# Patient Record
Sex: Female | Born: 1975
Health system: Southern US, Community
[De-identification: ages and names within clinical notes are randomized; demographics above are authoritative.]

## PROBLEM LIST (undated history)

## (undated) DIAGNOSIS — M519 Unspecified thoracic, thoracolumbar and lumbosacral intervertebral disc disorder: Secondary | ICD-10-CM

## (undated) HISTORY — PX: WISDOM TOOTH EXTRACTION: SHX21

## (undated) HISTORY — DX: Unspecified thoracic, thoracolumbar and lumbosacral intervertebral disc disorder: M51.9

## (undated) HISTORY — PX: INTRAUTERINE DEVICE (IUD) INSERTION: SHX5877

---

## 1999-12-26 ENCOUNTER — Encounter: Admission: RE | Admit: 1999-12-26 | Discharge: 1999-12-26 | Payer: Self-pay | Admitting: Family Medicine

## 1999-12-26 ENCOUNTER — Encounter: Payer: Self-pay | Admitting: Family Medicine

## 1999-12-30 ENCOUNTER — Other Ambulatory Visit: Admission: RE | Admit: 1999-12-30 | Discharge: 1999-12-30 | Payer: Self-pay | Admitting: Obstetrics and Gynecology

## 2000-11-13 ENCOUNTER — Encounter: Admission: RE | Admit: 2000-11-13 | Discharge: 2000-11-13 | Payer: Self-pay | Admitting: Family Medicine

## 2000-11-13 ENCOUNTER — Encounter: Payer: Self-pay | Admitting: Family Medicine

## 2001-02-24 ENCOUNTER — Other Ambulatory Visit: Admission: RE | Admit: 2001-02-24 | Discharge: 2001-02-24 | Payer: Self-pay | Admitting: Obstetrics and Gynecology

## 2001-12-01 ENCOUNTER — Emergency Department (HOSPITAL_COMMUNITY): Admission: EM | Admit: 2001-12-01 | Discharge: 2001-12-02 | Payer: Self-pay

## 2002-09-26 ENCOUNTER — Encounter: Admission: RE | Admit: 2002-09-26 | Discharge: 2002-09-26 | Payer: Self-pay | Admitting: Family Medicine

## 2002-09-26 ENCOUNTER — Encounter: Payer: Self-pay | Admitting: Family Medicine

## 2004-10-16 ENCOUNTER — Other Ambulatory Visit: Admission: RE | Admit: 2004-10-16 | Discharge: 2004-10-16 | Payer: Self-pay | Admitting: Obstetrics and Gynecology

## 2007-05-07 ENCOUNTER — Other Ambulatory Visit: Admission: RE | Admit: 2007-05-07 | Discharge: 2007-05-07 | Payer: Self-pay | Admitting: Obstetrics and Gynecology

## 2008-05-02 ENCOUNTER — Ambulatory Visit: Payer: Self-pay | Admitting: Obstetrics and Gynecology

## 2008-05-03 ENCOUNTER — Ambulatory Visit: Payer: Self-pay | Admitting: Obstetrics and Gynecology

## 2008-11-21 ENCOUNTER — Inpatient Hospital Stay (HOSPITAL_COMMUNITY): Admission: AD | Admit: 2008-11-21 | Discharge: 2008-11-24 | Payer: Self-pay | Admitting: Obstetrics and Gynecology

## 2010-10-29 LAB — CBC
HCT: 28.5 % — ABNORMAL LOW (ref 36.0–46.0)
HCT: 34.4 % — ABNORMAL LOW (ref 36.0–46.0)
Hemoglobin: 10.1 g/dL — ABNORMAL LOW (ref 12.0–15.0)
Hemoglobin: 12.3 g/dL (ref 12.0–15.0)
MCHC: 35.5 g/dL (ref 30.0–36.0)
MCHC: 35.6 g/dL (ref 30.0–36.0)
MCV: 93.6 fL (ref 78.0–100.0)
MCV: 95.2 fL (ref 78.0–100.0)
Platelets: 161 10*3/uL (ref 150–400)
Platelets: 181 10*3/uL (ref 150–400)
RBC: 2.99 MIL/uL — ABNORMAL LOW (ref 3.87–5.11)
RBC: 3.68 MIL/uL — ABNORMAL LOW (ref 3.87–5.11)
RDW: 14.1 % (ref 11.5–15.5)
RDW: 14.4 % (ref 11.5–15.5)
WBC: 8.6 10*3/uL (ref 4.0–10.5)
WBC: 9.7 10*3/uL (ref 4.0–10.5)

## 2010-10-29 LAB — RPR: RPR Ser Ql: NONREACTIVE

## 2010-12-03 NOTE — Op Note (Signed)
[name], [name]               ACCOUNT NO.:  [id number]   MEDICAL RECORD NO.:  [id number]          PATIENT TYPE:  INP   LOCATION:  9130                          FACILITY:  [facility]   PHYSICIAN:  Malva Limes, M.D.    DATE OF BIRTH:  1976-05-21   DATE OF PROCEDURE:  11/21/2008  DATE OF DISCHARGE:                               OPERATIVE REPORT   PREOPERATIVE DIAGNOSES:  1. Intrauterine pregnancy at term.  2. Persistent breech presentation.  3. The patient declined attempt at version, external version.  4. Bipolar disorder.   POSTOPERATIVE DIAGNOSES:  1. Intrauterine pregnancy at term.  2. Persistent breech presentation.  3. The patient declined attempt at version, external version.  4. Bipolar disorder.   PROCEDURE:  Primary low transverse cesarean section.   SURGEON:  Malva Limes, MD   ASSISTANT:  Luvenia Redden, MD   ANESTHESIA:  Spinal.   ANTIBIOTIC:  Ancef 1 g.   DRAINS:  Foley to bedside drainage.   ESTIMATED BLOOD LOSS:  900 mL.   COMPLICATIONS:  None.   SPECIMENS:  None.   FINDINGS:  The patient had normal fallopian tubes and ovaries  bilaterally.  The uterus appeared to be normal in shape.  There was no  evidence of any uterine septum.   PROCEDURE IN DETAIL:  The patient was taken to the operating room where  spinal anesthetic was administered without difficulty.  She was then  placed in dorsal supine position with left lateral tilt.  The patient  was prepped with Betadine and draped in the usual fashion for this  procedure.  A Foley catheter was placed.  A Pfannenstiel incision was  made approximately 2 cm above the pubic symphysis.  On entering the  abdominal cavity, the bladder flap was taken down with sharp dissection.  The uterine incision was made in the midline with Metzenbaum scissors.  The amniotic sac was entered and fluid noted clear.  The incision was  extended with blunt dissection.  The infant was delivered in a breech  presentation.  On  delivery of the head, the oropharynx and nostrils were  bulb suctioned.  The cord was doubly clamped and cut, and the infant was  handed to the awaiting NICU team.  Placenta was manually removed.  The  uterus was exteriorized.  The uterine cavity was cleaned with a wet lap  and inspected.  The uterine incision was closed in a single layer of 0  Monocryl in a running locking fashion.  The bladder flap was closed  using 2-0 Monocryl in a running fashion.  Uterus was placed back in the  abdominal cavity.  Hemostasis was checked and felt to be adequate.  The  parietal peritoneum and rectus muscles were approximated in the midline  using 2-0 Monocryl in a running fashion.  The fascia was closed using 0  Monocryl suture in a running fashion.  Subcuticular tissue was made  hemostatic with a Bovie.  The subcuticular tissue was closed with  interrupted 2-0 plain gut suture.  The skin was closed with 3-0 Vicryl  in a subcuticular fashion.  Steri-Strips  were then applied.  The patient  was taken to the recovery room in stable condition.  Instrument and lap  counts were correct x2.           ______________________________  Malva Limes, M.D.     MA/MEDQ  D:  11/21/2008  T:  11/21/2008  Job:  [id number]

## 2010-12-06 NOTE — Discharge Summary (Signed)
NAMEAIRIKA, Lindsey [name]               ACCOUNT NO.:  [id number]   MEDICAL RECORD NO.:  [id number]          PATIENT TYPE:  INP   LOCATION:  9130                          FACILITY:  [facility]   PHYSICIAN:  Randye Lobo, M.D.   DATE OF BIRTH:  Nov 20, 1975   DATE OF ADMISSION:  11/21/2008  DATE OF DISCHARGE:  11/24/2008                               DISCHARGE SUMMARY   FINAL DIAGNOSES:  Intrauterine pregnancy at term, persistent breech  presentation, declines attempted external cephalic version, bipolar  disorder.   PROCEDURE:  Primary low transverse cesarean section.   SURGEON:  Malva Limes, MD   ASSISTANT:  Luvenia Redden, MD   COMPLICATIONS:  None.   This 35 year old G2, P 0-0-1-0 presents at 59 weeks' gestation for a  primary cesarean section.  The patient has had a persistent breech  presentation and declines any attempts at external version.  The  patient's antepartum course up to this point have been complicated known  to bipolar disorder.  The patient had been on Lamictal and Klonopin  throughout her pregnancy; otherwise, she has had an uncomplicated  antepartum course.  The patient was taken to the operating room on Nov 21, 2008, by Dr. Malva Limes where a primary low transverse cesarean  section was performed with the delivery of an 8-pound 7-ounce female  infant with Apgars of 9 and 9.  The delivery went without complications.  The patient's postoperative course was benign without any significant  fevers.  The patient was felt ready for discharge on postoperative day  #3.  The patient was sent home on a regular diet, told to decrease  activities, told to continue her prenatal vitamins, given a prescription  for Percocet 1-2 every 4-6 hours as needed for pain, told she could use  over-the-counter ibuprofen up to 600 mg every 6 hours as needed for  pain, and discussion was held with the patient regarding Lamictal and  Klonopin use during breastfeeding, and the patient will be  talking to  her psychiatrist and pediatrician to determine that use.  The patient  was to follow up in our office in 4 weeks.  Instructions and precautions  were reviewed with the patient.   LABORATORY FINDINGS ON DISCHARGE:  The patient had a hemoglobin of 10.1,  white blood cell count of 9.7, and platelets of 161,000.      Leilani Able, P.A.-C.      Randye Lobo, M.D.  Electronically Signed   MB/MEDQ  D:  12/13/2008  T:  12/14/2008  Job:  [id number]

## 2012-04-29 ENCOUNTER — Encounter: Payer: Self-pay | Admitting: Gynecology

## 2012-04-29 ENCOUNTER — Ambulatory Visit (INDEPENDENT_AMBULATORY_CARE_PROVIDER_SITE_OTHER): Payer: BC Managed Care – PPO

## 2012-04-29 ENCOUNTER — Ambulatory Visit (INDEPENDENT_AMBULATORY_CARE_PROVIDER_SITE_OTHER): Payer: BC Managed Care – PPO | Admitting: Gynecology

## 2012-04-29 VITALS — BP 132/88 | Ht 64.0 in | Wt 247.0 lb

## 2012-04-29 DIAGNOSIS — R102 Pelvic and perineal pain: Secondary | ICD-10-CM

## 2012-04-29 DIAGNOSIS — N949 Unspecified condition associated with female genital organs and menstrual cycle: Secondary | ICD-10-CM

## 2012-04-29 DIAGNOSIS — Q51810 Arcuate uterus: Secondary | ICD-10-CM

## 2012-04-29 DIAGNOSIS — M519 Unspecified thoracic, thoracolumbar and lumbosacral intervertebral disc disorder: Secondary | ICD-10-CM | POA: Insufficient documentation

## 2012-04-29 DIAGNOSIS — Q519 Congenital malformation of uterus and cervix, unspecified: Secondary | ICD-10-CM

## 2012-04-29 DIAGNOSIS — E282 Polycystic ovarian syndrome: Secondary | ICD-10-CM | POA: Insufficient documentation

## 2012-04-29 DIAGNOSIS — N8300 Follicular cyst of ovary, unspecified side: Secondary | ICD-10-CM

## 2012-04-29 NOTE — Progress Notes (Addendum)
Patient is a new patient to the practice she is a 36 year old gravida 1 para 1 who had a C-section 3 years ago in another group here in [facility] [place]. The reason for her visit today as for the past 4 months she states that for a few days before her menses she begins having suprapubic discomfort near her incision site. 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 She is overweight with a BMI of 42.40 (247 pounds). She is using condoms for contraception. She also states that time she has noted some hair underneath her chin. She denies any dyspareunia. She states her cycles are regular that last for 5 days. She has been under the care of Dr. Sharolyn Douglas because chronic back pain as a result of a torn L4-L5 disc for which she takes Neurontin. Patient states her last Pap smear was in 2009. Patient denies any abnormal Pap smears in the past.  Exam: Abdomen pendulous Pfannenstiel scar intact I was not able to reproduce any tenderness at the Pfannenstiel incision site. There was no evidence of any incisional hernia on Valsalva maneuver or any inguinal or femoral hernia. She was then examined in the erect position and no hernias in these regions were palpated. After she was laying down we did do a pelvic exam: Bartholin urethra Skene and: Within normal limits Vagina: No lesion discharge Cervix: No lesion discharge uterus/adnexa: Limited due to patient's abdominal girth. Rectal exam: Not done  Ultrasound: Uterus measures 7.2 x 6.1 x 4.0 cm endometrial stripe 5.7 mm uterus appears to be either an arcuate versus septated uterus with Right endometrium 5 mm left endometrium 4 mm. Both right and left ovary were normal. Several follicles are noted on each ovary. No apparent masses seen on either adnexa.  Assessment/plan: We discussed patient's symptomatology that could be attributed to endometriosis although she has no dyspareunia but her discomfort is usually a few  days from the start of her menses. We discussed different treatment options such as oral contraceptive pill which would decrease her circulating testosterone levels and help with her orally skin and hair underneath her chin since she could potentially be PCO S. We also discussed the Depo-Provera injection but she is concerned about weight gain. We did discuss the Lupron but of explained her that would be mostly for 6 months but not for long-term treatment. Finally we discussed the Mirena IUD which would help on her dysmenorrhea menorrhagia pelvic pain and in the event that she does have underlying endometriosis may help as well. Literature information was provided all the subjects and she's going to return back next week so we can do of sonohysterogram to determine if indeed it is a septum and to see how far down into the cavity it does extend because she may not be a candidate for an IUD placement and then she will need to consider oral contraceptive pill (her concern the past was weight gain on the oral contraceptive pill. Patient otherwise states her cycles are regular.

## 2012-04-29 NOTE — Patient Instructions (Addendum)
Endometriosis Endometriosis is a disease that occurs when the endometrium (lining of the uterus) is misplaced outside of its normal location. It may occur in many locations close to the uterus (womb), but commonly on the ovaries, fallopian tubes, vagina (birth canal) and bowel located close to the uterus. Because the uterus sloughs (expels) its lining every month (menses), there is bleeding whereever the endometrial tissue is located. SYMPTOMS  Often there are no symptoms. However, because blood is irritating to tissues not normally exposed to it, when symptoms occur they vary with the location of the misplaced endometrium. Symptoms often include back and abdominal pain. Periods may be heavier and intercourse may be painful. Infertility may be present. You may have all of these symptoms at one time or another or you may have months with no symptoms at all. Although the symptoms occur mainly during menses, they can occur mid-cycle as well, and usually terminate with menopause. DIAGNOSIS  Your caregiver may recommend a blood test and urine test (urinalysis) to help rule out other conditions. Another common test is ultrasound, a painless procedure that uses sound waves to make a sonogram "picture" of abnormal tissue that could be endometriosis. If your bowel movements are painful around your periods, your caregiver may advise a barium enema (an X-ray of the lower bowel), to try to find the source of your pain. This is sometimes confirmed by laparoscopy. Laparoscopy is a procedure where your caregiver looks into your abdomen with a laparoscope (a small pencil sized telescope). Your caregiver may take a tiny piece of tissue (biopsy) from any abnormal tissue to confirm or document your problem. These tissues are sent to the lab and a pathologist looks at them under the microscope to give a microscopic diagnosis. TREATMENT  Once the diagnosis is made, it can be treated by destruction of the misplaced endometrial  tissue using heat (diathermy), laser, cutting (excision), or chemical means. It may also be treated with hormonal therapy. When using hormonal therapy menses are eliminated, therefore eliminating the monthly exposure to blood by the misplaced endometrial tissue. Only in severe cases is it necessary to perform a hysterectomy with removal of the tubes, uterus and ovaries. HOME CARE INSTRUCTIONS   Only take over-the-counter or prescription medicines for pain, discomfort, or fever as directed by your caregiver.  Avoid activities that produce pain, including a physical sexual relationship.  Do not take aspirin as this may increase bleeding when not on hormonal therapy.  See your caregiver for pain or problems not controlled with treatment. SEEK IMMEDIATE MEDICAL CARE IF:   Your pain is severe and is not responding to pain medicine.  You develop severe nausea and vomiting, or you cannot keep foods down.  Your pain localizes to the right lower part of your abdomen (possible appendicitis).  You have swelling or increasing pain in the abdomen.  You have a fever.  You see blood in your stool. MAKE SURE YOU:   Understand these instructions.  Will watch your condition.  Will get help right away if you are not doing well or get worse. Document Released: 07/04/2000 Document Revised: 09/29/2011 Document Reviewed: 02/23/2008 [facility] Patient Information 2013 [place], [place].  Intrauterine Device Information An intrauterine device (IUD) is inserted into your uterus and prevents pregnancy. There are 2 types of IUDs available:  Copper IUD. This type of IUD is wrapped in copper wire and is placed inside the uterus. Copper makes the uterus and fallopian tubes produce a fluid that kills sperm. The copper IUD can  stay in place for 10 years.  Hormone IUD. This type of IUD contains the hormone progestin (synthetic progesterone). The hormone thickens the cervical mucus and prevents sperm from entering  the uterus, and it also thins the uterine lining to prevent implantation of a fertilized egg. The hormone can weaken or kill the sperm that get into the uterus. The hormone IUD can stay in place for 5 years. Your caregiver will make sure you are a good candidate for a contraceptive IUD. Discuss with your caregiver the possible side effects. ADVANTAGES  It is highly effective, reversible, long-acting, and low maintenance.  There are no estrogen-related side effects.  An IUD can be used when breastfeeding.  It is not associated with weight gain.  It works immediately after insertion.  The copper IUD does not interfere with your female hormones.  The progesterone IUD can make heavy menstrual periods lighter.  The progesterone IUD can be used for 5 years.  The copper IUD can be used for 10 years. DISADVANTAGES  The progesterone IUD can be associated with irregular bleeding patterns.  The copper IUD can make your menstrual flow heavier and more painful.  You may experience cramping and vaginal bleeding after insertion. Document Released: 06/10/2004 Document Revised: 09/29/2011 Document Reviewed: 11/09/2010 [facility] Patient Information 2013 [place], [place].  Oral Contraception Use Oral contraceptives (OCs) are medicines taken to prevent pregnancy. OCs work by preventing the ovaries from releasing eggs. The hormones in OCs also cause the cervical mucus to thicken, preventing the sperm from entering the uterus. The hormones also cause the uterine lining to become thin, not allowing a fertilized egg to attach to the inside of the uterus. OCs are highly effective when taken exactly as prescribed. However, OCs do not prevent sexually transmitted diseases (STDs). Safe sex practices, such as using condoms along with an OC, can help prevent STDs.  Before taking OCs, you may have a physical exam and Pap test. Your caregiver may also order blood tests if necessary. Your caregiver will make sure you  are a good candidate for oral contraception. Discuss with your caregiver the possible side effects of the OC you may be prescribed. When starting an OC, it can take 2 to 3 months for the body to adjust to the changes in hormone levels in your body.  HOW TO TAKE ORAL CONTRACEPTIVES Your caregiver may advise you on how to start taking the first cycle of OCs. Otherwise, you can:  Start on day 1 of your menstrual period. You will not need any backup contraceptive protection with this start time.  Start on the first Sunday after your menstrual period or the day you get your prescription. In these cases, you will need to use backup contraceptive protection for the first 7-day cycle. After you have started taking OCs:  If you forget to take 1 pill, take it as soon as you remember. Take the next pill at the regular time.  If you miss 2 or more pills, use backup birth control until your next menstrual period starts.  If you use a 28-day pack that contains inactive pills and you miss 1 of the last 7 pills (pills with no hormones), it will not matter. Throw away the rest of the non-hormone pills and start a new pill pack. No matter which day you start the OC, you will always start a new pack on that same day of the week. Have an extra pack of OCs and a backup contraceptive method available in case you miss  some pills or lose your OC pack. HOME CARE INSTRUCTIONS   Do not smoke.  Always use a condom to protect against STDs. OCs do not protect against STDs.  Use a calendar to mark your menstrual period days.  Read the information and directions that come with your OC. Talk to your caregiver if you have questions. SEEK MEDICAL CARE IF:   You develop nausea and vomiting.  You have abnormal vaginal discharge or bleeding.  You develop a rash.  You miss your menstrual period.  You are losing your hair.  You need treatment for mood swings or depression.  You get dizzy when taking the OC.  You  develop acne from taking the OC.  You become pregnant. SEEK IMMEDIATE MEDICAL CARE IF:   You develop chest pain.  You develop shortness of breath.  You have an uncontrolled or severe headache.  You develop numbness or slurred speech.  You develop visual problems.  You develop pain, redness, and swelling in the legs. Document Released: 06/26/2011 Document Revised: 09/29/2011 Document Reviewed: 06/26/2011 [facility] Patient Information 2013 [place], [place].

## 2012-05-07 ENCOUNTER — Ambulatory Visit (INDEPENDENT_AMBULATORY_CARE_PROVIDER_SITE_OTHER): Payer: BC Managed Care – PPO

## 2012-05-07 ENCOUNTER — Ambulatory Visit (INDEPENDENT_AMBULATORY_CARE_PROVIDER_SITE_OTHER): Payer: BC Managed Care – PPO | Admitting: Gynecology

## 2012-05-07 DIAGNOSIS — Z309 Encounter for contraceptive management, unspecified: Secondary | ICD-10-CM

## 2012-05-07 DIAGNOSIS — Q519 Congenital malformation of uterus and cervix, unspecified: Secondary | ICD-10-CM

## 2012-05-07 DIAGNOSIS — N920 Excessive and frequent menstruation with regular cycle: Secondary | ICD-10-CM

## 2012-05-07 DIAGNOSIS — N949 Unspecified condition associated with female genital organs and menstrual cycle: Secondary | ICD-10-CM

## 2012-05-07 DIAGNOSIS — R102 Pelvic and perineal pain: Secondary | ICD-10-CM

## 2012-05-07 NOTE — Progress Notes (Signed)
Patient was seen in the office October 18 sleep detailed note.The reason for her visit today as for the past 4 months she states that for a few days before her menses she begins having suprapubic discomfort near her incision site. 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 She is overweight with a BMI of 42.40 (247 pounds). She is using condoms for contraception. She also states that time she has noted some hair underneath her chin. She denies any dyspareunia. She states her cycles are regular that last for 5 days. She has been under the care of Dr. Sharolyn Douglas because chronic back pain as a result of a torn L4-L5 disc for which she takes Neurontin. Patient states her last Pap smear was in 2009.  Patient had an ultrasound that last office visit with the following findings:  Uterus measures 7.2 x 6.1 x 4.0 cm endometrial stripe 5.7 mm uterus appears to be either an arcuate versus septated uterus with Right endometrium 5 mm left endometrium 4 mm. Both right and left ovary were normal. Several follicles are noted on each ovary. No apparent masses seen on either adnexa.   We discussed patient's symptomatology that could be attributed to endometriosis although she has no dyspareunia but her discomfort is usually a few days from the start of her menses. We discussed different treatment options such as oral contraceptive pill which would decrease her circulating testosterone levels and help with her orally skin and hair underneath her chin since she could potentially be PCO S. We also discussed the Depo-Provera injection but she is concerned about weight gain. We did discuss the Lupron but of explained her that would be mostly for 6 months but not for long-term treatment. Finally we discussed the Mirena IUD which would help on her dysmenorrhea menorrhagia pelvic pain and in the event that she does have underlying endometriosis may help as well. Literature information was  provided all the subjects.  Patient presented to the office today for sonohysterogram and further discussion. Sonohysterogram today demonstrated no intrauterine defect normal-appearing uterus. There appears to be no septum or any evidence of arcuate anatomy. Patient is interested in proceeding with a Mirena IUD its risks benefits and pros and cons were discussed and she will schedule one to be placed at the onset of her next menses.

## 2012-05-07 NOTE — Patient Instructions (Addendum)
Call the office with the start of you next menses so that we can place a Mirena IUD.

## 2012-05-10 ENCOUNTER — Telehealth: Payer: Self-pay | Admitting: Gynecology

## 2012-05-10 NOTE — Telephone Encounter (Signed)
I LM on pt VM that the Mirena IUD and insertion are covd at 100% after a $25.00 cpay. She is to call to let us know and was advised it is inserted while she is on her cycle.WL

## 2012-05-18 ENCOUNTER — Other Ambulatory Visit: Payer: Self-pay | Admitting: Gynecology

## 2012-05-18 ENCOUNTER — Telehealth: Payer: Self-pay | Admitting: Gynecology

## 2012-05-18 DIAGNOSIS — Z3049 Encounter for surveillance of other contraceptives: Secondary | ICD-10-CM

## 2012-05-18 MED ORDER — LEVONORGESTREL 20 MCG/24HR IU IUD: INTRAUTERINE_SYSTEM | Freq: Once | INTRAUTERINE | Status: DC

## 2012-05-18 NOTE — Telephone Encounter (Signed)
Pt returned my call of 05/10/12 stating she does want to proceed with Mirena IUD insertion. Appt scheduled with JF for 05/26/12. Lindsey [name] advised to set one aside for her to use,noting $25.00 copay only/WL

## 2012-05-26 ENCOUNTER — Encounter: Payer: Self-pay | Admitting: Gynecology

## 2012-05-26 ENCOUNTER — Ambulatory Visit (INDEPENDENT_AMBULATORY_CARE_PROVIDER_SITE_OTHER): Payer: BC Managed Care – PPO

## 2012-05-26 ENCOUNTER — Ambulatory Visit (INDEPENDENT_AMBULATORY_CARE_PROVIDER_SITE_OTHER): Payer: BC Managed Care – PPO | Admitting: Gynecology

## 2012-05-26 VITALS — BP 124/82

## 2012-05-26 DIAGNOSIS — Z30430 Encounter for insertion of intrauterine contraceptive device: Secondary | ICD-10-CM

## 2012-05-26 DIAGNOSIS — T8339XA Other mechanical complication of intrauterine contraceptive device, initial encounter: Secondary | ICD-10-CM

## 2012-05-26 NOTE — Patient Instructions (Addendum)
Intrauterine Device Information  An intrauterine device (IUD) is inserted into your uterus and prevents pregnancy. There are 2 types of IUDs available:  · Copper IUD. This type of IUD is wrapped in copper wire and is placed inside the uterus. Copper makes the uterus and fallopian tubes produce a fluid that kills sperm. The copper IUD can stay in place for 10 years.  · Hormone IUD. This type of IUD contains the hormone progestin (synthetic progesterone). The hormone thickens the cervical mucus and prevents sperm from entering the uterus, and it also thins the uterine lining to prevent implantation of a fertilized egg. The hormone can weaken or kill the sperm that get into the uterus. The hormone IUD can stay in place for 5 years.  Your caregiver will make sure you are a good candidate for a contraceptive IUD. Discuss with your caregiver the possible side effects.  ADVANTAGES  · It is highly effective, reversible, long-acting, and low maintenance.  · There are no estrogen-related side effects.  · An IUD can be used when breastfeeding.  · It is not associated with weight gain.  · It works immediately after insertion.  · The copper IUD does not interfere with your female hormones.  · The progesterone IUD can make heavy menstrual periods lighter.  · The progesterone IUD can be used for 5 years.  · The copper IUD can be used for 10 years.  DISADVANTAGES  · The progesterone IUD can be associated with irregular bleeding patterns.  · The copper IUD can make your menstrual flow heavier and more painful.  · You may experience cramping and vaginal bleeding after insertion.  Document Released: 06/10/2004 Document Revised: 09/29/2011 Document Reviewed: 11/09/2010  [employer]® Patient Information ©2013 [employer].

## 2012-05-26 NOTE — Progress Notes (Signed)
Patient presented to the office today for placement of Mirena IUD. See previous note dated October 18. Patient fully read the consent form on the Mirena IUD. Patient fully where this form of contraception is good for 5 years and is 99% effective.  Exam: Bartholin urethra Skene was within normal limits Vagina: Menstrual blood present Cervix: Menstrual blood present Uterus: Slightly anteverted no palpable masses or tenderness Adnexa: No palpable masses or tenderness Rectal: Not examined  Her seizure note: The vagina was cleansed with Betadine solution. The anterior cervical lip was grasped with a single-tooth tenaculum. The uterus sounded to 7 cm. The Mirena IUD was placed in sterile fashion and the string was trimmed. The single-tooth tenaculum was then removed. Patient tolerated procedure well. She was then asked to go the ultrasound department to confirm adequate placement in one single uterine cavity. Results of ultrasound is follow:  IUD was seen on transvaginal images within the endometrial cavity. Unable to identify the T-shaped of the IUD: IUD located mid uterus.  Patient will return back in one month for followup and we'll reconfirm that the IUD still in the right position since there was question in the past a bicornuate uterus but after reviewing the films once again and after sonohysterogram is appears to be just a small arcuate

## 2012-06-25 ENCOUNTER — Other Ambulatory Visit: Payer: BC Managed Care – PPO

## 2012-06-25 ENCOUNTER — Ambulatory Visit: Payer: BC Managed Care – PPO | Admitting: Gynecology

## 2012-07-02 ENCOUNTER — Ambulatory Visit: Payer: BC Managed Care – PPO | Admitting: Gynecology

## 2012-07-02 ENCOUNTER — Other Ambulatory Visit: Payer: BC Managed Care – PPO

## 2013-04-22 ENCOUNTER — Encounter: Payer: Self-pay | Admitting: Gynecology

## 2013-04-22 ENCOUNTER — Ambulatory Visit (INDEPENDENT_AMBULATORY_CARE_PROVIDER_SITE_OTHER): Payer: Managed Care, Other (non HMO) | Admitting: Gynecology

## 2013-04-22 DIAGNOSIS — R102 Pelvic and perineal pain: Secondary | ICD-10-CM

## 2013-04-22 DIAGNOSIS — N949 Unspecified condition associated with female genital organs and menstrual cycle: Secondary | ICD-10-CM

## 2013-04-22 DIAGNOSIS — N764 Abscess of vulva: Secondary | ICD-10-CM

## 2013-04-22 MED ORDER — DOXYCYCLINE HYCLATE 100 MG PO CAPS: ORAL_CAPSULE | ORAL | Status: DC

## 2013-04-22 MED ORDER — KETOROLAC TROMETHAMINE 60 MG/2ML IM SOLN
60.0000 mg | Freq: Once | INTRAMUSCULAR | Status: AC
  Administered 2013-04-22: 60 mg via INTRAMUSCULAR

## 2013-04-22 MED ORDER — HYDROCODONE-ACETAMINOPHEN 5-325 MG PO TABS: 1.0000 | ORAL_TABLET | Freq: Four times a day (QID) | ORAL | Status: DC | PRN

## 2013-04-22 NOTE — Addendum Note (Signed)
Addended by: Bertram Savin A on: 04/22/2013 03:51 PM   Modules accepted: Orders

## 2013-04-22 NOTE — Addendum Note (Signed)
Addended by: Rushie Goltz on: 04/22/2013 03:21 PM   Modules accepted: Orders

## 2013-04-22 NOTE — Progress Notes (Signed)
Patient is a 37 year old that presented to the office today complaining of a right labial Mass that started to drain this morning. Patient states that it feels like his throbbing in causing a lot of pain and discomfort. She did sitz bath yesterday and that's what is more she started to notice that draining. She denied any fever. She is doing roll well with her Mirena IUD and claims that she is no longer having heavy periods her back pain like she used to.    Exam:  Physical Exam  Genitourinary:     3 x 4 cm right upper labial abscess. The air was cleansed with Betadine solution. One percent lidocaine was infiltrated subcutaneously. A small stab incision was made and purulent-like material was extruded. Culture for MRSA was obtained. The area was copious irrigated with hydrogen peroxide. The loculations were broken with a curved hemostat. Nugauze which was placed in the defect. The patient will remove it tomorrow after 24 hours. She will be started on Vibramycin 100 mg twice a day for 2 weeks for broad-spectrum coverage. She was instructed to clean the area with antibacterial soap release once a day. She is to apply Neosporin or Bactroban cream twice a week for the next 2 weeks. She is scheduled to return back next month for her annual exam I will reinspect the area. She will apply ice packs to the area twice a day. She was called in a prescription for Tylox to take one by mouth every 4-6 hours when necessary discomfort. She did receive Toradol 60 mg IM in the office today.

## 2013-04-22 NOTE — Addendum Note (Signed)
Addended by: Bertram Savin A on: 04/22/2013 02:28 PM   Modules accepted: Orders

## 2013-04-22 NOTE — Patient Instructions (Addendum)
Folliculitis  Folliculitis is redness, soreness, and swelling (inflammation) of the hair follicles. This condition can occur anywhere on the body. People with weakened immune systems, diabetes, or obesity have a greater risk of getting folliculitis. CAUSES  Bacterial infection. This is the most common cause.  Fungal infection.  Viral infection.  Contact with certain chemicals, especially oils and tars. Long-term folliculitis can result from bacteria that live in the nostrils. The bacteria may trigger multiple outbreaks of folliculitis over time. SYMPTOMS Folliculitis most commonly occurs on the scalp, thighs, legs, back, buttocks, and areas where hair is shaved frequently. An early sign of folliculitis is a small, white or yellow, pus-filled, itchy lesion (pustule). These lesions appear on a red, inflamed follicle. They are usually less than 0.2 inches (5 mm) wide. When there is an infection of the follicle that goes deeper, it becomes a boil or furuncle. A group of closely packed boils creates a larger lesion (carbuncle). Carbuncles tend to occur in hairy, sweaty areas of the body. DIAGNOSIS  Your caregiver can usually tell what is wrong by doing a physical exam. A sample may be taken from one of the lesions and tested in a lab. This can help determine what is causing your folliculitis. TREATMENT  Treatment may include:  Applying warm compresses to the affected areas.  Taking antibiotic medicines orally or applying them to the skin.  Draining the lesions if they contain a large amount of pus or fluid.  Laser hair removal for cases of long-lasting folliculitis. This helps to prevent regrowth of the hair. HOME CARE INSTRUCTIONS  Apply warm compresses to the affected areas as directed by your caregiver.  If antibiotics are prescribed, take them as directed. Finish them even if you start to feel better.  You may take over-the-counter medicines to relieve itching.  Do not shave  irritated skin.  Follow up with your caregiver as directed. SEEK IMMEDIATE MEDICAL CARE IF:   You have increasing redness, swelling, or pain in the affected area.  You have a fever. MAKE SURE YOU:  Understand these instructions.  Will watch your condition.  Will get help right away if you are not doing well or get worse. Document Released: 09/15/2001 Document Revised: 01/06/2012 Document Reviewed: 10/07/2011 Exit[employer] Patient Information 2014 [employer].  

## 2013-04-22 NOTE — Progress Notes (Deleted)
Patient is a 37 year old that presented to the office today complaining of a right labial Mass that started to drain this morning. Patient states that it feels like his throbbing in causing a lot of pain and discomfort. She did sitz bath yesterday and that's what is more she started to notice that draining. She denied any fever. She is doing roll well with her Mirena IUD and claims that she is no longer having heavy periods her back pain like she used to.    Exam: Physical Exam

## 2013-04-26 LAB — WOUND CULTURE: Gram Stain: NONE SEEN

## 2013-05-24 ENCOUNTER — Encounter: Payer: Managed Care, Other (non HMO) | Admitting: Gynecology

## 2013-06-14 ENCOUNTER — Encounter: Payer: Managed Care, Other (non HMO) | Admitting: Gynecology

## 2014-02-28 ENCOUNTER — Ambulatory Visit (INDEPENDENT_AMBULATORY_CARE_PROVIDER_SITE_OTHER): Payer: Managed Care, Other (non HMO) | Admitting: Gynecology

## 2014-02-28 VITALS — BP 130/84

## 2014-02-28 DIAGNOSIS — N949 Unspecified condition associated with female genital organs and menstrual cycle: Secondary | ICD-10-CM

## 2014-02-28 DIAGNOSIS — G8918 Other acute postprocedural pain: Secondary | ICD-10-CM

## 2014-02-28 DIAGNOSIS — R102 Pelvic and perineal pain: Secondary | ICD-10-CM

## 2014-02-28 DIAGNOSIS — B9689 Other specified bacterial agents as the cause of diseases classified elsewhere: Secondary | ICD-10-CM

## 2014-02-28 DIAGNOSIS — A499 Bacterial infection, unspecified: Secondary | ICD-10-CM

## 2014-02-28 DIAGNOSIS — Z113 Encounter for screening for infections with a predominantly sexual mode of transmission: Secondary | ICD-10-CM

## 2014-02-28 DIAGNOSIS — N898 Other specified noninflammatory disorders of vagina: Secondary | ICD-10-CM

## 2014-02-28 DIAGNOSIS — T8389XA Other specified complication of genitourinary prosthetic devices, implants and grafts, initial encounter: Secondary | ICD-10-CM

## 2014-02-28 DIAGNOSIS — N760 Acute vaginitis: Secondary | ICD-10-CM

## 2014-02-28 DIAGNOSIS — T8384XA Pain from genitourinary prosthetic devices, implants and grafts, initial encounter: Secondary | ICD-10-CM

## 2014-02-28 LAB — URINALYSIS W MICROSCOPIC + REFLEX CULTURE
BILIRUBIN URINE: NEGATIVE
CASTS: NONE SEEN
CRYSTALS: NONE SEEN
Glucose, UA: NEGATIVE mg/dL
Hgb urine dipstick: NEGATIVE
KETONES UR: NEGATIVE mg/dL
NITRITE: NEGATIVE
PH: 7 (ref 5.0–8.0)
Protein, ur: NEGATIVE mg/dL
RBC / HPF: NONE SEEN RBC/hpf (ref <3)
SPECIFIC GRAVITY, URINE: 1.01 (ref 1.005–1.030)
UROBILINOGEN UA: 0.2 mg/dL (ref 0.0–1.0)

## 2014-02-28 LAB — WET PREP FOR TRICH, YEAST, CLUE
Clue Cells Wet Prep HPF POC: NONE SEEN
Trich, Wet Prep: NONE SEEN
Yeast Wet Prep HPF POC: NONE SEEN

## 2014-02-28 MED ORDER — TINIDAZOLE 500 MG PO TABS: ORAL_TABLET | ORAL | Status: DC

## 2014-02-28 NOTE — Progress Notes (Signed)
   38 year old patient who presented to the office today stating that she has been having some pelvic discomfort she feels like it's coming from her uterus and radiating toward her back. Sometimes a relieved the discomfort she feels like she has to do a Valsalva maneuver. She's having some slight vaginal discharge. She has a Mirena IUD that was placed in 2013. Patient denies any dysuria, frequency, nausea, vomiting, fever, or chills.  Exam: Back: No CVA tenderness Abdomen pendulous nontender no rebound or guarding Pelvic: The urethra Skene was within normal limits Vagina: No lesions or discharge Cervix: IUD string visualized Bimanual examination limited due to patient Lindsey [name] birth but no pain was elicited and no large masses were palpated on either adnexa. Rectal exam: Not done  Wet prep moderate WBCs few bacteria  Urinalysis WBC 3-6, bacteria few culture pending  Assessment/plan: Patient will return back next week for an ultrasound to assess position of the IUD. Review of ultrasound 2013 stating that the IUD was sitting in a low midportion of the uterine cavity. A GC and Chlamydia cultures pending. Urine culture pending. For her apparent d bacterial vaginosis she will be prescribed Tindamax 500 mg tablet she is to take 4 tablets a day for tablets tomorrow.

## 2014-02-28 NOTE — Addendum Note (Signed)
Addended by: Dayna BarkerGARDNER, KIMBERLY K on: 02/28/2014 01:01 PM   Modules accepted: Orders

## 2014-02-28 NOTE — Patient Instructions (Signed)
Bacterial Vaginosis Bacterial vaginosis is a vaginal infection that occurs when the normal balance of bacteria in the vagina is disrupted. It results from an overgrowth of certain bacteria. This is the most common vaginal infection in women of childbearing age. Treatment is important to prevent complications, especially in pregnant women, as it can cause a premature delivery. CAUSES  Bacterial vaginosis is caused by an increase in harmful bacteria that are normally present in smaller amounts in the vagina. Several different kinds of bacteria can cause bacterial vaginosis. However, the reason that the condition develops is not fully understood. RISK FACTORS Certain activities or behaviors can put you at an increased risk of developing bacterial vaginosis, including:  Having a new sex partner or multiple sex partners.  Douching.  Using an intrauterine device (IUD) for contraception. Women do not get bacterial vaginosis from toilet seats, bedding, swimming pools, or contact with objects around them. SIGNS AND SYMPTOMS  Some women with bacterial vaginosis have no signs or symptoms. Common symptoms include:  Grey vaginal discharge.  A fishlike odor with discharge, especially after sexual intercourse.  Itching or burning of the vagina and vulva.  Burning or pain with urination. DIAGNOSIS  Your health care provider will take a medical history and examine the vagina for signs of bacterial vaginosis. A sample of vaginal fluid may be taken. Your health care provider will look at this sample under a microscope to check for bacteria and abnormal cells. A vaginal pH test may also be done.  TREATMENT  Bacterial vaginosis may be treated with antibiotic medicines. These may be given in the form of a pill or a vaginal cream. A second round of antibiotics may be prescribed if the condition comes back after treatment.  HOME CARE INSTRUCTIONS   Only take over-the-counter or prescription medicines as  directed by your health care provider.  If antibiotic medicine was prescribed, take it as directed. Make sure you finish it even if you start to feel better.  Do not have sex until treatment is completed.  Tell all sexual partners that you have a vaginal infection. They should see their health care provider and be treated if they have problems, such as a mild rash or itching.  Practice safe sex by using condoms and only having one sex partner. SEEK MEDICAL CARE IF:   Your symptoms are not improving after 3 days of treatment.  You have increased discharge or pain.  You have a fever. MAKE SURE YOU:   Understand these instructions.  Will watch your condition.  Will get help right away if you are not doing well or get worse. FOR MORE INFORMATION  Centers for Disease Control and Prevention, Division of STD Prevention: www.cdc.gov/std American Sexual Health Association (ASHA): www.ashastd.org  Document Released: 07/07/2005 Document Revised: 04/27/2013 Document Reviewed: 02/16/2013 ExitCare Patient Information 2015 [employer]. This information is not intended to replace advice given to you by your health care provider. Make sure you discuss any questions you have with your health care provider.  

## 2014-03-01 LAB — GC/CHLAMYDIA PROBE AMP
CT PROBE, AMP APTIMA: NEGATIVE
GC PROBE AMP APTIMA: NEGATIVE

## 2014-03-01 LAB — URINE CULTURE

## 2014-03-08 ENCOUNTER — Other Ambulatory Visit: Payer: Self-pay | Admitting: Gynecology

## 2014-03-08 ENCOUNTER — Ambulatory Visit (INDEPENDENT_AMBULATORY_CARE_PROVIDER_SITE_OTHER): Payer: Managed Care, Other (non HMO)

## 2014-03-08 ENCOUNTER — Ambulatory Visit (INDEPENDENT_AMBULATORY_CARE_PROVIDER_SITE_OTHER): Payer: Managed Care, Other (non HMO) | Admitting: Gynecology

## 2014-03-08 ENCOUNTER — Encounter: Payer: Self-pay | Admitting: Gynecology

## 2014-03-08 VITALS — BP 120/82

## 2014-03-08 DIAGNOSIS — R102 Pelvic and perineal pain: Secondary | ICD-10-CM

## 2014-03-08 DIAGNOSIS — T8384XA Pain from genitourinary prosthetic devices, implants and grafts, initial encounter: Secondary | ICD-10-CM

## 2014-03-08 DIAGNOSIS — G8918 Other acute postprocedural pain: Secondary | ICD-10-CM

## 2014-03-08 DIAGNOSIS — T889XXS Complication of surgical and medical care, unspecified, sequela: Secondary | ICD-10-CM

## 2014-03-08 DIAGNOSIS — T8389XA Other specified complication of genitourinary prosthetic devices, implants and grafts, initial encounter: Secondary | ICD-10-CM

## 2014-03-08 DIAGNOSIS — N8300 Follicular cyst of ovary, unspecified side: Secondary | ICD-10-CM

## 2014-03-08 DIAGNOSIS — N949 Unspecified condition associated with female genital organs and menstrual cycle: Secondary | ICD-10-CM

## 2014-03-08 DIAGNOSIS — T8389XS Other specified complication of genitourinary prosthetic devices, implants and grafts, sequela: Principal | ICD-10-CM

## 2014-03-08 DIAGNOSIS — T8332XS Displacement of intrauterine contraceptive device, sequela: Secondary | ICD-10-CM

## 2014-03-08 NOTE — Progress Notes (Signed)
   38 year old patient presented to the office today to discuss her ultrasound. She was seen in the office on August 11 having pelvic discomfort with the sensation of her uterus was daily ready to come out. She does have a Mirena IUD that was placed in 2013. She denied any other complaints. She had a GC and chlamydia culture which were negative. Wet prep was suspicious for bacterial vaginosis and she was started on Tindamax 500 mg 4 tablets daily for 2 days. She is asymptomatic today.  Ultrasound report: Uterus measures 7.9 x 5.1 x 3.3 cm with endometrial stripe of 4.2 mm. The uterus was seen in the normal position. Right and left ovary were normal. Several follicles were noted. No fluid in the cul-de-sac. Left ovary was noted to be located adjacent to the left uterine wall.  Assessment/plan: Patient treated recently for bacterial vaginosis doing well asymptomatic. IUD in the proper position. Patient scheduled to return to the office at the end of the year for her annual exam or when necessary.

## 2014-04-04 ENCOUNTER — Encounter: Payer: Managed Care, Other (non HMO) | Admitting: Gynecology

## 2014-05-22 ENCOUNTER — Encounter: Payer: Self-pay | Admitting: Gynecology

## 2014-05-24 ENCOUNTER — Encounter: Payer: Managed Care, Other (non HMO) | Admitting: Gynecology

## 2014-06-22 ENCOUNTER — Encounter: Payer: Managed Care, Other (non HMO) | Admitting: Gynecology

## 2015-04-17 ENCOUNTER — Ambulatory Visit (HOSPITAL_COMMUNITY): Admit: 2015-04-17 | Payer: Self-pay | Admitting: Family Medicine

## 2015-04-17 ENCOUNTER — Encounter (HOSPITAL_COMMUNITY): Payer: Self-pay

## 2015-04-17 SURGERY — Surgical Case
Anesthesia: Choice

## 2015-04-26 ENCOUNTER — Ambulatory Visit: Payer: Managed Care, Other (non HMO) | Admitting: Gynecology

## 2015-04-30 ENCOUNTER — Encounter: Payer: Managed Care, Other (non HMO) | Admitting: Gynecology

## 2015-05-21 ENCOUNTER — Encounter: Payer: Self-pay | Admitting: Gynecology

## 2015-05-21 ENCOUNTER — Other Ambulatory Visit (HOSPITAL_COMMUNITY)
Admission: RE | Admit: 2015-05-21 | Discharge: 2015-05-21 | Disposition: A | Discharge status: Home / Self Care | Payer: Managed Care, Other (non HMO) | Source: Ambulatory Visit | Attending: Gynecology | Admitting: Gynecology

## 2015-05-21 ENCOUNTER — Ambulatory Visit (INDEPENDENT_AMBULATORY_CARE_PROVIDER_SITE_OTHER): Payer: Managed Care, Other (non HMO) | Admitting: Gynecology

## 2015-05-21 VITALS — BP 120/70 | Ht 66.0 in | Wt 230.8 lb

## 2015-05-21 DIAGNOSIS — Z01419 Encounter for gynecological examination (general) (routine) without abnormal findings: Secondary | ICD-10-CM | POA: Diagnosis not present

## 2015-05-21 DIAGNOSIS — Z975 Presence of (intrauterine) contraceptive device: Secondary | ICD-10-CM

## 2015-05-21 DIAGNOSIS — L680 Hirsutism: Secondary | ICD-10-CM | POA: Insufficient documentation

## 2015-05-21 DIAGNOSIS — R634 Abnormal weight loss: Secondary | ICD-10-CM | POA: Diagnosis not present

## 2015-05-21 DIAGNOSIS — Z1151 Encounter for screening for human papillomavirus (HPV): Secondary | ICD-10-CM | POA: Insufficient documentation

## 2015-05-21 DIAGNOSIS — Z124 Encounter for screening for malignant neoplasm of cervix: Secondary | ICD-10-CM

## 2015-05-21 DIAGNOSIS — N921 Excessive and frequent menstruation with irregular cycle: Secondary | ICD-10-CM | POA: Diagnosis not present

## 2015-05-21 NOTE — Progress Notes (Signed)
Lindsey [name] 07/27/1975 [id number]   History:    39 y.o.  for annual gyn exam who is complaining since she's had some vaginal spotting with the Mirena IUD over the past few months. She states she has lost 46 pounds in the past 6 months. She is also seen the dermatologist because she is experienced oily skin and acne since she has lost so much weight. She does continue to smoke. Her dermatologist had tried her on Aldactone but did not tolerate it. Patient also has complained of some hirsutism especially on the chin. Patient reports no past history of any abnormal Pap smears or no history of STD. Patient had the Mirena IUD placed in 2013.  Past medical history,surgical history, family history and social history were all reviewed and documented in the EPIC chart.  Gynecologic History No LMP recorded. Patient is not currently having periods (Reason: IUD). Contraception: IUD Last Pap: Greater than 3 years ago. Results were: normal Last mammogram: Not indicated. Results were: Not indicated  Obstetric History OB History  Gravida Para Term Preterm AB SAB TAB Ectopic Multiple Living  2 1 1  1     1     # Outcome Date GA Lbr Len/2nd Weight Sex Delivery Anes PTL Lv  2 AB           1 Term     F CS-Unspec  N Y       ROS: A ROS was performed and pertinent positives and negatives are included in the history.  GENERAL: No fevers or chills. HEENT: No change in vision, no earache, sore throat or sinus congestion. NECK: No pain or stiffness. CARDIOVASCULAR: No chest pain or pressure. No palpitations. PULMONARY: No shortness of breath, cough or wheeze. GASTROINTESTINAL: No abdominal pain, nausea, vomiting or diarrhea, melena or bright red blood per rectum. GENITOURINARY: No urinary frequency, urgency, hesitancy or dysuria. MUSCULOSKELETAL: No joint or muscle pain, no back pain, no recent trauma. DERMATOLOGIC: No rash, no itching, no lesions. ENDOCRINE: No polyuria, polydipsia, no heat or cold intolerance.  No recent change in weight. HEMATOLOGICAL: No anemia or easy bruising or bleeding. NEUROLOGIC: No headache, seizures, numbness, tingling or weakness. PSYCHIATRIC: No depression, no loss of interest in normal activity or change in sleep pattern.     Exam: chaperone present  BP 120/70 mmHg  Ht 5\' 6"  (1.676 m)  Wt 230 lb 12.8 oz (104.69 kg)  BMI 37.27 kg/m2  Body mass index is 37.27 kg/(m^2).  General appearance : Well developed well nourished female. No acute distress HEENT: Eyes: no retinal hemorrhage or exudates,  Neck supple, trachea midline, no carotid bruits, no thyroidmegaly Lungs: Clear to auscultation, no rhonchi or wheezes, or rib retractions  Heart: Regular rate and rhythm, no murmurs or gallops Breast:Examined in sitting and supine position were symmetrical in appearance, no palpable masses or tenderness,  no skin retraction, no nipple inversion, no nipple discharge, no skin discoloration, no axillary or supraclavicular lymphadenopathy Abdomen: no palpable masses or tenderness, no rebound or guarding Extremities: no edema or skin discoloration or tenderness  Pelvic:  Bartholin, Urethra, Skene Glands: Within normal limits             Vagina: No gross lesions or discharge  Cervix: No gross lesions or discharge IUD string visualized  Uterus  anteverted, normal size, shape and consistency, non-tender and mobile  Adnexa  Without masses or tenderness  Anus and perineum  normal   Rectovaginal  normal sphincter tone without palpated masses or  tenderness             Hemoccult not indicated     Assessment/Plan:  39 y.o. female for annual exam patient will return back to the office later this week for fasting blood work to include the following: Comprehensive metabolic panel, fasting lipid profile, TSH, CBC, and urinalysis. We'll also schedule an ultrasound to make sure that there has not been a shift in the position of the IUD contributing to her spotting. I do believe this is  attributed to her weight loss of 46 pounds in the past 6 months. We discussed options such as removing the Mirena IUD and put her on a oral contraceptive pill but it would be contraindicated due to the fact that she smokes and she is over 62 years of age. She's going to continue to monitor her symptoms. We are also check her testosterone level less well. She was counseled once again the detrimental effects of smoking. She stated she had quit several years ago which she took the Chantix and does have a prescription and is deciding whether to start it or not. Ok Edwards MD, 1:54 PM 05/21/2015

## 2015-05-21 NOTE — Addendum Note (Signed)
Addended by: Richardson ChiquitoWILKINSON, Seydou Hearns S on: 05/21/2015 02:30 PM   Modules accepted: Orders

## 2015-05-24 LAB — CYTOLOGY - PAP

## 2015-06-13 ENCOUNTER — Ambulatory Visit: Payer: Managed Care, Other (non HMO) | Admitting: Gynecology

## 2015-06-13 ENCOUNTER — Other Ambulatory Visit: Payer: Managed Care, Other (non HMO)

## 2015-07-05 ENCOUNTER — Ambulatory Visit (INDEPENDENT_AMBULATORY_CARE_PROVIDER_SITE_OTHER): Payer: Managed Care, Other (non HMO)

## 2015-07-05 ENCOUNTER — Encounter: Payer: Self-pay | Admitting: Gynecology

## 2015-07-05 ENCOUNTER — Ambulatory Visit (INDEPENDENT_AMBULATORY_CARE_PROVIDER_SITE_OTHER): Payer: Managed Care, Other (non HMO) | Admitting: Gynecology

## 2015-07-05 ENCOUNTER — Other Ambulatory Visit: Payer: Self-pay | Admitting: Gynecology

## 2015-07-05 VITALS — BP 132/78

## 2015-07-05 DIAGNOSIS — N921 Excessive and frequent menstruation with irregular cycle: Secondary | ICD-10-CM

## 2015-07-05 DIAGNOSIS — N831 Corpus luteum cyst of ovary, unspecified side: Secondary | ICD-10-CM | POA: Diagnosis not present

## 2015-07-05 DIAGNOSIS — Z30431 Encounter for routine checking of intrauterine contraceptive device: Secondary | ICD-10-CM | POA: Diagnosis not present

## 2015-07-05 DIAGNOSIS — Z975 Presence of (intrauterine) contraceptive device: Principal | ICD-10-CM

## 2015-07-05 DIAGNOSIS — N83202 Unspecified ovarian cyst, left side: Secondary | ICD-10-CM

## 2015-07-05 NOTE — Patient Instructions (Signed)
Ovarian Cyst An ovarian cyst is a fluid-filled sac that forms on an ovary. The ovaries are small organs that produce eggs in women. Various types of cysts can form on the ovaries. Most are not cancerous. Many do not cause problems, and they often go away on their own. Some may cause symptoms and require treatment. Common types of ovarian cysts include:  Functional cysts--These cysts may occur every month during the menstrual cycle. This is normal. The cysts usually go away with the next menstrual cycle if the woman does not get pregnant. Usually, there are no symptoms with a functional cyst.  Endometrioma cysts--These cysts form from the tissue that lines the uterus. They are also called "chocolate cysts" because they become filled with blood that turns brown. This type of cyst can cause pain in the lower abdomen during intercourse and with your menstrual period.  Cystadenoma cysts--This type develops from the cells on the outside of the ovary. These cysts can get very big and cause lower abdomen pain and pain with intercourse. This type of cyst can twist on itself, cut off its blood supply, and cause severe pain. It can also easily rupture and cause a lot of pain.  Dermoid cysts--This type of cyst is sometimes found in both ovaries. These cysts may contain different kinds of body tissue, such as skin, teeth, hair, or cartilage. They usually do not cause symptoms unless they get very big.  Theca lutein cysts--These cysts occur when too much of a certain hormone (human chorionic gonadotropin) is produced and overstimulates the ovaries to produce an egg. This is most common after procedures used to assist with the conception of a baby (in vitro fertilization). CAUSES   Fertility drugs can cause a condition in which multiple large cysts are formed on the ovaries. This is called ovarian hyperstimulation syndrome.  A condition called polycystic ovary syndrome can cause hormonal imbalances that can lead to  nonfunctional ovarian cysts. SIGNS AND SYMPTOMS  Many ovarian cysts do not cause symptoms. If symptoms are present, they may include:  Pelvic pain or pressure.  Pain in the lower abdomen.  Pain during sexual intercourse.  Increasing girth (swelling) of the abdomen.  Abnormal menstrual periods.  Increasing pain with menstrual periods.  Stopping having menstrual periods without being pregnant. DIAGNOSIS  These cysts are commonly found during a routine or annual pelvic exam. Tests may be ordered to find out more about the cyst. These tests may include:  Ultrasound.  X-ray of the pelvis.  CT scan.  MRI.  Blood tests. TREATMENT  Many ovarian cysts go away on their own without treatment. Your health care provider may want to check your cyst regularly for 2-3 months to see if it changes. For women in menopause, it is particularly important to monitor a cyst closely because of the higher rate of ovarian cancer in menopausal women. When treatment is needed, it may include any of the following:  A procedure to drain the cyst (aspiration). This may be done using a long needle and ultrasound. It can also be done through a laparoscopic procedure. This involves using a thin, lighted tube with a tiny camera on the end (laparoscope) inserted through a small incision.  Surgery to remove the whole cyst. This may be done using laparoscopic surgery or an open surgery involving a larger incision in the lower abdomen.  Hormone treatment or birth control pills. These methods are sometimes used to help dissolve a cyst. HOME CARE INSTRUCTIONS   Only take over-the-counter   or prescription medicines as directed by your health care provider.  Follow up with your health care provider as directed.  Get regular pelvic exams and Pap tests. SEEK MEDICAL CARE IF:   Your periods are late, irregular, or painful, or they stop.  Your pelvic pain or abdominal pain does not go away.  Your abdomen becomes  larger or swollen.  You have pressure on your bladder or trouble emptying your bladder completely.  You have pain during sexual intercourse.  You have feelings of fullness, pressure, or discomfort in your stomach.  You lose weight for no apparent reason.  You feel generally ill.  You become constipated.  You lose your appetite.  You develop acne.  You have an increase in body and facial hair.  You are gaining weight, without changing your exercise and eating habits.  You think you are pregnant. SEEK IMMEDIATE MEDICAL CARE IF:   You have increasing abdominal pain.  You feel sick to your stomach (nauseous), and you throw up (vomit).  You develop a fever that comes on suddenly.  You have abdominal pain during a bowel movement.  Your menstrual periods become heavier than usual. MAKE SURE YOU:  Understand these instructions.  Will watch your condition.  Will get help right away if you are not doing well or get worse.   This information is not intended to replace advice given to you by your health care provider. Make sure you discuss any questions you have with your health care provider.   Document Released: 07/07/2005 Document Revised: 07/12/2013 Document Reviewed: 03/14/2013 Elsevier Interactive Patient Education 2016 [employer]  

## 2015-07-05 NOTE — Progress Notes (Signed)
   Patient is a 39 year old was seen the office on October 31 of this year for her annual exam. Patient had a Mirena IUD placed in 2013 and at time of exam recently the string was not visualized and this is the reason for the ultrasound today in the office visit. She will need to return to the office next week for her fasting blood work. She is otherwise a VBAC today.  Ultrasound: Uterus measured 8.2 cm x 5.57 x 4.2 cm within mutual stripe of 3.7 mm. IUD was seen in the normal position. A slight deviation of the uterus to patient's right. Right ovary normal. A left ovarian follicle measured 13 x 18 mm with thinwall echo-free cyst with a small thin septum measuring 20 x 20 mm. Boluses otherwise negative with no color-flow and no fluid in the cul-de-sac.  Assessment/plan small left functional cyst in the left ovary patient was reassured no suspicious features. Patient otherwise asymptomatic IUD in the proper position. Patient to return next week for fasting blood work. Patient to return back in one year or when necessary.

## 2016-08-15 ENCOUNTER — Telehealth: Payer: Self-pay | Admitting: *Deleted

## 2016-08-15 DIAGNOSIS — Z01419 Encounter for gynecological examination (general) (routine) without abnormal findings: Secondary | ICD-10-CM

## 2016-08-15 DIAGNOSIS — Z1329 Encounter for screening for other suspected endocrine disorder: Secondary | ICD-10-CM

## 2016-08-15 DIAGNOSIS — Z13220 Encounter for screening for lipoid disorders: Secondary | ICD-10-CM

## 2016-08-15 DIAGNOSIS — L680 Hirsutism: Secondary | ICD-10-CM

## 2016-08-15 NOTE — Telephone Encounter (Signed)
Pt has annual scheduled on 08/28/16 lab orders were placed in 2016 for cmet, cbc,lipid profile, tsh, u/a testosterone level, pt was c/o hirsutism, never came back to have them drawn in 2016. pt  would like same labs done this prior to annual.  Do you want to order any additional labs? If no I will place new orders for these labs. Let me know

## 2016-08-15 NOTE — Telephone Encounter (Signed)
New orders placed

## 2016-08-15 NOTE — Telephone Encounter (Signed)
These labs will be fine

## 2016-08-18 ENCOUNTER — Other Ambulatory Visit: Payer: Managed Care, Other (non HMO)

## 2016-08-18 ENCOUNTER — Other Ambulatory Visit: Payer: Self-pay | Admitting: Gynecology

## 2016-08-18 DIAGNOSIS — Z13220 Encounter for screening for lipoid disorders: Secondary | ICD-10-CM

## 2016-08-18 DIAGNOSIS — Z01419 Encounter for gynecological examination (general) (routine) without abnormal findings: Secondary | ICD-10-CM

## 2016-08-18 DIAGNOSIS — Z1329 Encounter for screening for other suspected endocrine disorder: Secondary | ICD-10-CM

## 2016-08-18 LAB — CBC WITH DIFFERENTIAL/PLATELET
BASOS ABS: 73 {cells}/uL (ref 0–200)
Basophils Relative: 1 %
EOS PCT: 7 %
Eosinophils Absolute: 511 cells/uL — ABNORMAL HIGH (ref 15–500)
HCT: 41 % (ref 35.0–45.0)
HEMOGLOBIN: 14.2 g/dL (ref 11.7–15.5)
LYMPHS ABS: 2044 {cells}/uL (ref 850–3900)
Lymphocytes Relative: 28 %
MCH: 31.1 pg (ref 27.0–33.0)
MCHC: 34.6 g/dL (ref 32.0–36.0)
MCV: 89.7 fL (ref 80.0–100.0)
MONO ABS: 292 {cells}/uL (ref 200–950)
MPV: 9.3 fL (ref 7.5–12.5)
Monocytes Relative: 4 %
NEUTROS ABS: 4380 {cells}/uL (ref 1500–7800)
Neutrophils Relative %: 60 %
Platelets: 278 10*3/uL (ref 140–400)
RBC: 4.57 MIL/uL (ref 3.80–5.10)
RDW: 13.2 % (ref 11.0–15.0)
WBC: 7.3 10*3/uL (ref 3.8–10.8)

## 2016-08-18 LAB — URINALYSIS W MICROSCOPIC + REFLEX CULTURE
BACTERIA UA: NONE SEEN [HPF]
Bilirubin Urine: NEGATIVE
CRYSTALS: NONE SEEN [HPF]
Casts: NONE SEEN [LPF]
Glucose, UA: NEGATIVE
HGB URINE DIPSTICK: NEGATIVE
KETONES UR: NEGATIVE
Nitrite: NEGATIVE
PROTEIN: NEGATIVE
RBC / HPF: NONE SEEN RBC/HPF (ref <=2)
Specific Gravity, Urine: 1.005 (ref 1.001–1.035)
Yeast: NONE SEEN [HPF]
pH: 6.5 (ref 5.0–8.0)

## 2016-08-18 LAB — COMPREHENSIVE METABOLIC PANEL
ALK PHOS: 65 U/L (ref 33–115)
ALT: 29 U/L (ref 6–29)
AST: 17 U/L (ref 10–30)
Albumin: 4.2 g/dL (ref 3.6–5.1)
BUN: 8 mg/dL (ref 7–25)
CALCIUM: 9.2 mg/dL (ref 8.6–10.2)
CHLORIDE: 106 mmol/L (ref 98–110)
CO2: 27 mmol/L (ref 20–31)
Creat: 0.68 mg/dL (ref 0.50–1.10)
GLUCOSE: 93 mg/dL (ref 65–99)
POTASSIUM: 4.1 mmol/L (ref 3.5–5.3)
Sodium: 140 mmol/L (ref 135–146)
Total Bilirubin: 0.3 mg/dL (ref 0.2–1.2)
Total Protein: 6.8 g/dL (ref 6.1–8.1)

## 2016-08-18 LAB — LIPID PANEL
CHOL/HDL RATIO: 4.7 ratio (ref <5.0)
CHOLESTEROL: 178 mg/dL (ref <200)
HDL: 38 mg/dL — ABNORMAL LOW (ref >50)
LDL Cholesterol: 129 mg/dL — ABNORMAL HIGH (ref <100)
TRIGLYCERIDES: 57 mg/dL (ref <150)
VLDL: 11 mg/dL (ref <30)

## 2016-08-18 LAB — TSH: TSH: 3.5 mIU/L

## 2016-08-19 LAB — URINE CULTURE: ORGANISM ID, BACTERIA: NO GROWTH

## 2016-08-20 ENCOUNTER — Other Ambulatory Visit: Payer: Self-pay | Admitting: Gynecology

## 2016-08-20 DIAGNOSIS — D721 Eosinophilia: Principal | ICD-10-CM

## 2016-08-20 DIAGNOSIS — R898 Other abnormal findings in specimens from other organs, systems and tissues: Secondary | ICD-10-CM

## 2016-08-21 LAB — TESTOSTERONE, TOTAL, LC/MS/MS: TESTOSTERONE, TOTAL, LC-MS-MS: 40 ng/dL (ref 2–45)

## 2016-08-28 ENCOUNTER — Ambulatory Visit (INDEPENDENT_AMBULATORY_CARE_PROVIDER_SITE_OTHER): Payer: Managed Care, Other (non HMO) | Admitting: Gynecology

## 2016-08-28 ENCOUNTER — Encounter: Payer: Self-pay | Admitting: Gynecology

## 2016-08-28 VITALS — BP 128/80 | Ht 66.0 in | Wt 273.0 lb

## 2016-08-28 DIAGNOSIS — F17200 Nicotine dependence, unspecified, uncomplicated: Secondary | ICD-10-CM | POA: Diagnosis not present

## 2016-08-28 DIAGNOSIS — L680 Hirsutism: Secondary | ICD-10-CM | POA: Diagnosis not present

## 2016-08-28 DIAGNOSIS — Z01411 Encounter for gynecological examination (general) (routine) with abnormal findings: Secondary | ICD-10-CM | POA: Diagnosis not present

## 2016-08-28 MED ORDER — DROSPIRENONE-ETHINYL ESTRADIOL 3-0.03 MG PO TABS: ORAL_TABLET | ORAL | 4 refills | Status: DC

## 2016-08-28 MED ORDER — VARENICLINE TARTRATE 0.5 MG PO TABS
ORAL_TABLET | ORAL | 2 refills | Status: DC
Start: 2016-08-28 — End: 2017-09-28

## 2016-08-28 NOTE — Patient Instructions (Addendum)
Steps to Quit Smoking Smoking tobacco can be harmful to your health and can affect almost every organ in your body. Smoking puts you, and those around you, at risk for developing many serious chronic diseases. Quitting smoking is difficult, but it is one of the best things that you can do for your health. It is never too late to quit. What are the benefits of quitting smoking? When you quit smoking, you lower your risk of developing serious diseases and conditions, such as:  Lung cancer or lung disease, such as COPD.  Heart disease.  Stroke.  Heart attack.  Infertility.  Osteoporosis and bone fractures. Additionally, symptoms such as coughing, wheezing, and shortness of breath may get better when you quit. You may also find that you get sick less often because your body is stronger at fighting off colds and infections. If you are pregnant, quitting smoking can help to reduce your chances of having a baby of low birth weight. How do I get ready to quit? When you decide to quit smoking, create a plan to make sure that you are successful. Before you quit:  Pick a date to quit. Set a date within the next two weeks to give you time to prepare.  Write down the reasons why you are quitting. Keep this list in places where you will see it often, such as on your bathroom mirror or in your car or wallet.  Identify the people, places, things, and activities that make you want to smoke (triggers) and avoid them. Make sure to take these actions:  Throw away all cigarettes at home, at work, and in your car.  Throw away smoking accessories, such as Scientist, research (medical).  Clean your car and make sure to empty the ashtray.  Clean your home, including curtains and carpets.  Tell your family, friends, and coworkers that you are quitting. Support from your loved ones can make quitting easier.  Talk with your health care provider about your options for quitting smoking.  Find out what treatment  options are covered by your health insurance. What strategies can I use to quit smoking? Talk with your healthcare provider about different strategies to quit smoking. Some strategies include:  Quitting smoking altogether instead of gradually lessening how much you smoke over a period of time. Research shows that quitting "cold [place]" is more successful than gradually quitting.  Attending in-person counseling to help you build problem-solving skills. You are more likely to have success in quitting if you attend several counseling sessions. Even short sessions of 10 minutes can be effective.  Finding resources and support systems that can help you to quit smoking and remain smoke-free after you quit. These resources are most helpful when you use them often. They can include:  Online chats with a Social worker.  Telephone quitlines.  Printed Furniture conservator/restorer.  Support groups or group counseling.  Text messaging programs.  Mobile phone applications.  Taking medicines to help you quit smoking. (If you are pregnant or breastfeeding, talk with your health care provider first.) Some medicines contain nicotine and some do not. Both types of medicines help with cravings, but the medicines that include nicotine help to relieve withdrawal symptoms. Your health care provider may recommend:  Nicotine patches, gum, or lozenges.  Nicotine inhalers or sprays.  Non-nicotine medicine that is taken by mouth. Talk with your health care provider about combining strategies, such as taking medicines while you are also receiving in-person counseling. Using these two strategies together makes you more  likely to succeed in quitting than if you used either strategy on its own. If you are pregnant or breastfeeding, talk with your health care provider about finding counseling or other support strategies to quit smoking. Do not take medicine to help you quit smoking unless told to do so by your health care  provider. What things can I do to make it easier to quit? Quitting smoking might feel overwhelming at first, but there is a lot that you can do to make it easier. Take these important actions:  Reach out to your family and friends and ask that they support and encourage you during this time. Call telephone quitlines, reach out to support groups, or work with a counselor for support.  Ask people who smoke to avoid smoking around you.  Avoid places that trigger you to smoke, such as bars, parties, or smoke-break areas at work.  Spend time around people who do not smoke.  Lessen stress in your life, because stress can be a smoking trigger for some people. To lessen stress, try:  Exercising regularly.  Deep-breathing exercises.  Yoga.  Meditating.  Performing a body scan. This involves closing your eyes, scanning your body from head to toe, and noticing which parts of your body are particularly tense. Purposefully relax the muscles in those areas.  Download or purchase mobile phone or tablet apps (applications) that can help you stick to your quit plan by providing reminders, tips, and encouragement. There are many free apps, such as QuitGuide from the [employer] developer(Centers for Disease Control and Prevention). You can find other support for quitting smoking (smoking cessation) through smokefree.gov and other websites. How will I feel when I quit smoking? Within the first 24 hours of quitting smoking, you may start to feel some withdrawal symptoms. These symptoms are usually most noticeable 2-3 days after quitting, but they usually do not last beyond 2-3 weeks. Changes or symptoms that you might experience include:  Mood swings.  Restlessness, anxiety, or irritation.  Difficulty concentrating.  Dizziness.  Strong cravings for sugary foods in addition to nicotine.  Mild weight gain.  Constipation.  Nausea.  Coughing or a sore throat.  Changes in how your medicines work in your  body.  A depressed mood.  Difficulty sleeping (insomnia). After the first 2-3 weeks of quitting, you may start to notice more positive results, such as:  Improved sense of smell and taste.  Decreased coughing and sore throat.  Slower heart rate.  Lower blood pressure.  Clearer skin.  The ability to breathe more easily.  Fewer sick days. Quitting smoking is very challenging for most people. Do not get discouraged if you are not successful the first time. Some people need to make many attempts to quit before they achieve long-term success. Do your best to stick to your quit plan, and talk with your health care provider if you have any questions or concerns. This information is not intended to replace advice given to you by your health care provider. Make sure you discuss any questions you have with your health care provider. Document Released: 07/01/2001 Document Revised: 03/04/2016 Document Reviewed: 11/21/2014 Elsevier Interactive Patient Education  2017 Elsevier Inc. Oral Contraception Information Oral contraceptive pills (OCPs) are medicines taken to prevent pregnancy. OCPs work by preventing the ovaries from releasing eggs. The hormones in OCPs also cause the cervical mucus to thicken, preventing the sperm from entering the uterus. The hormones also cause the uterine lining to become thin, not allowing a fertilized egg to  attach to the inside of the uterus. OCPs are highly effective when taken exactly as prescribed. However, OCPs do not prevent sexually transmitted diseases (STDs). Safe sex practices, such as using condoms along with the pill, can help prevent STDs.  Before taking the pill, you may have a physical exam and Pap test. Your health care provider may order blood tests. The health care provider will make sure you are a good candidate for oral contraception. Discuss with your health care provider the possible side effects of the OCP you may be prescribed. When starting an OCP,  it can take 2 to 3 months for the body to adjust to the changes in hormone levels in your body.  TYPES OF ORAL CONTRACEPTION  The combination pill-This pill contains estrogen and progestin (synthetic progesterone) hormones. The combination pill comes in 21-day, 28-day, or 91-day packs. Some types of combination pills are meant to be taken continuously (365-day pills). With 21-day packs, you do not take pills for 7 days after the last pill. With 28-day packs, the pill is taken every day. The last 7 pills are without hormones. Certain types of pills have more than 21 hormone-containing pills. With 91-day packs, the first 84 pills contain both hormones, and the last 7 pills contain no hormones or contain estrogen only.  The minipill-This pill contains the progesterone hormone only. The pill is taken every day continuously. It is very important to take the pill at the same time each day. The minipill comes in packs of 28 pills. All 28 pills contain the hormone.  ADVANTAGES OF ORAL CONTRACEPTIVE PILLS  Decreases premenstrual symptoms.   Treats menstrual period cramps.   Regulates the menstrual cycle.   Decreases a heavy menstrual flow.   May treatacne, depending on the type of pill.   Treats abnormal uterine bleeding.   Treats polycystic ovarian syndrome.   Treats endometriosis.   Can be used as emergency contraception.  THINGS THAT CAN MAKE ORAL CONTRACEPTIVE PILLS LESS EFFECTIVE OCPs can be less effective if:   You forget to take the pill at the same time every day.   You have a stomach or intestinal disease that lessens the absorption of the pill.   You take OCPs with other medicines that make OCPs less effective, such as antibiotics, certain HIV medicines, and some seizure medicines.   You take expired OCPs.   You forget to restart the pill on day 7, when using the packs of 21 pills.  RISKS ASSOCIATED WITH ORAL CONTRACEPTIVE PILLS  Oral contraceptive pills can  sometimes cause side effects, such as:  Headache.  Nausea.  Breast tenderness.  Irregular bleeding or spotting. Combination pills are also associated with a small increased risk of:  Blood clots.  Heart attack.  Stroke. This information is not intended to replace advice given to you by your health care provider. Make sure you discuss any questions you have with your health care provider. Document Released: 09/27/2002 Document Revised: 10/29/2015 Document Reviewed: 12/26/2012 Elsevier Interactive Patient Education  2017 Elsevier Inc.    Bariatric Surgery Information Bariatric surgery, also called weight loss surgery, is a procedure that helps you lose weight. You may consider or your health care provider may suggest bariatric surgery if:  You are severely obese and have been unable to lose weight through diet and exercise.  You have health problems related to obesity, such as:  Type 2 diabetes.  Heart disease.  Lung disease. How does bariatric surgery help me lose weight? Bariatric surgery helps  you lose weight by decreasing how much food your body absorbs. This is done by closing off part of your stomach to make it smaller. This restricts the amount of food your stomach can hold. Bariatric surgery can also change your body's regular digestive process, so that food bypasses the parts of your body that absorb calories and nutrients. If you decide to have bariatric surgery, it is important to continue to eat a healthy diet and exercise regularly after the surgery. What are the different kinds of bariatric surgery? There are two kinds of bariatric surgeries:  Restrictive surgeries make your stomach smaller. They do not change your digestive process. The smaller the size of your new stomach, the less food you can eat. There are different types of restrictive surgeries.  Malabsorptive surgeries both make your stomach smaller and alter your digestive process so that your body  processes less calories and nutrients. These are the most common kind of bariatric surgery. There are different types of malabsorptive surgeries. What are the different types of restrictive surgery? Adjustable Gastric Banding  In this procedure, an inflatable band is placed around your stomach near the upper end. This makes the passageway for food into the rest of your stomach much smaller. The band can be adjusted, making it tighter or looser, by filling it with salt solution. Your surgeon can adjust the band based on how are you feeling and how much weight you are losing. The band can be removed in the future. Vertical Banded Gastroplasty  In this procedure, staples are used to separate your stomach into two parts, a small upper pouch and a bigger lower pouch. This decreases how much food you can eat. Sleeve Gastrectomy  In this procedure, your stomach is made smaller. This is done by surgically removing a large part of your stomach. When your stomach is smaller, you feel full more quickly and reduce how much you eat. What are the different types of malabsorptive surgery? Roux-en-Y Gastric Bypass (RGB)  This is the most common weight loss surgery. In this procedure, a small stomach pouch is created in the upper part of your stomach. Next, this small stomach pouch is attached directly to the middle part of your small intestine. The farther down your small intestine the new connection is made, the fewer calories and nutrients you will absorb. Biliopancreatic Diversion with Duodenal Switch (BPD/DS)  This is a multi-step procedure. In this procedure, a large part of your stomach is removed, making your stomach smaller. Next, this smaller stomach is attached to the lower part of your small intestine. Like the RGB surgery, you absorb fewer calories and nutrients the farther down your small intestine the attachment is made. What are the risks of bariatric surgery? As with any surgical procedure, each type  of bariatric surgery has its own risks. These risks also depend on your age, your overall health, and any other medical conditions you may have. When deciding on bariatric surgery, it is very important to:  Talk to your health care provider and choose the surgery that is best for you.  Ask your health care provider about specific risks for the surgery you choose. Where to find more information:  American Society for Metabolic & Bariatric Surgery: www.asmbs.org  Weight-control Information Network (WIN): win.StageSync.si This information is not intended to replace advice given to you by your health care provider. Make sure you discuss any questions you have with your health care provider. Document Released: 07/07/2005 Document Revised: 12/13/2015 Document Reviewed: 01/05/2013 Elsevier  Education  2017 Elsevier Inc.  

## 2016-08-28 NOTE — Progress Notes (Signed)
Lindsey [name] 05/28/1976 [id number]   History:    41 y.o.  for annual gyn exam who presented to the office with complaint of hirsutism and weight gain issues. Patient is a smoker whereby she smokes a pack cigarette per day. She has been counseled in the past. She has a Mirena IUD which is due to be removed this year.Her dermatologist had tried her on Aldactone but did not tolerate it. Patient also has complained of some hirsutism especially on the chin. Patient reports no past history of any abnormal Pap smears or no history of STD.  Past medical history,surgical history, family history and social history were all reviewed and documented in the EPIC chart.  Gynecologic History No LMP recorded. Patient is not currently having periods (Reason: IUD). Contraception: IUD Last Pap: 2016. Results were: normal Last mammogram: No previous study. Results were: No previous study  Obstetric History OB History  Gravida Para Term Preterm AB Living  2 1 1   1 1   SAB TAB Ectopic Multiple Live Births          1    # Outcome Date GA Lbr Len/2nd Weight Sex Delivery Anes PTL Lv  2 AB           1 Term     F CS-Unspec  N LIV       ROS: A ROS was performed and pertinent positives and negatives are included in the history.  GENERAL: No fevers or chills. HEENT: No change in vision, no earache, sore throat or sinus congestion. NECK: No pain or stiffness. CARDIOVASCULAR: No chest pain or pressure. No palpitations. PULMONARY: No shortness of breath, cough or wheeze. GASTROINTESTINAL: No abdominal pain, nausea, vomiting or diarrhea, melena or bright red blood per rectum. GENITOURINARY: No urinary frequency, urgency, hesitancy or dysuria. MUSCULOSKELETAL: No joint or muscle pain, no back pain, no recent trauma. DERMATOLOGIC: No rash, no itching, no lesions. ENDOCRINE: No polyuria, polydipsia, no heat or cold intolerance. No recent change in weight. HEMATOLOGICAL: No anemia or easy bruising or bleeding.  NEUROLOGIC: No headache, seizures, numbness, tingling or weakness. PSYCHIATRIC: No depression, no loss of interest in normal activity or change in sleep pattern.     Exam: chaperone present  BP 128/80   Ht 5\' 6"  (1.676 m)   Wt 273 lb (123.8 kg)   BMI 44.06 kg/m   Body mass index is 44.06 kg/m.  General appearance : Well developed well nourished female. No acute distress HEENT: Eyes: no retinal hemorrhage or exudates,  Neck supple, trachea midline, no carotid bruits, no thyroidmegaly Lungs: Clear to auscultation, no rhonchi or wheezes, or rib retractions  Heart: Regular rate and rhythm, no murmurs or gallops Breast:Examined in sitting and supine position were symmetrical in appearance, no palpable masses or tenderness,  no skin retraction, no nipple inversion, no nipple discharge, no skin discoloration, no axillary or supraclavicular lymphadenopathy Abdomen: no palpable masses or tenderness, no rebound or guarding Extremities: no edema or skin discoloration or tenderness  Pelvic:  Bartholin, Urethra, Skene Glands: Within normal limits             Vagina: No gross lesions or discharge  Cervix: No gross lesions or discharge, IUD string visualized  Uterus  anteverted, normal size, shape and consistency, non-tender and mobile  Adnexa  Without masses or tenderness  Anus and perineum  normal   Rectovaginal  normal sphincter tone without palpated masses or tenderness  Hemoccult not indicated   Limited pelvic exam due to patient's morbid obesity. Because of her hirsutism were going to do an ultrasound to rule out PCOS.  Assessment/Plan:  41 y.o. female for annual exam chronic smoker was counseled on the detrimental effects of smoking. Smoking cessation literature was provided. She is going to be started on Chantix. Risk benefits and pros and cons were discussed. She'll return in the summer to change her IUD. She'll return to the office in a week for an ultrasound to better assess  her ovaries and to rule out PCOS. Because of her hirsutism were going to check a 17 hydroxyprogesterone, total testosterone, DHEAS. Patient also is going to be referred to the general surgeons for consideration bariatric surgery as well. Pap smear not indicated this year. Her recent annual blood work were normal.    Reynaldo Minium H MD, 12:21 PM 08/28/2016

## 2016-09-01 ENCOUNTER — Encounter: Payer: Self-pay | Admitting: Gynecology

## 2016-09-01 ENCOUNTER — Other Ambulatory Visit: Payer: Self-pay | Admitting: Gynecology

## 2016-09-01 ENCOUNTER — Ambulatory Visit (INDEPENDENT_AMBULATORY_CARE_PROVIDER_SITE_OTHER): Payer: Managed Care, Other (non HMO)

## 2016-09-01 ENCOUNTER — Ambulatory Visit (INDEPENDENT_AMBULATORY_CARE_PROVIDER_SITE_OTHER): Payer: Managed Care, Other (non HMO) | Admitting: Gynecology

## 2016-09-01 VITALS — BP 130/80 | Ht 66.0 in | Wt 273.0 lb

## 2016-09-01 DIAGNOSIS — L680 Hirsutism: Secondary | ICD-10-CM

## 2016-09-01 DIAGNOSIS — N831 Corpus luteum cyst of ovary, unspecified side: Secondary | ICD-10-CM

## 2016-09-01 DIAGNOSIS — E282 Polycystic ovarian syndrome: Secondary | ICD-10-CM

## 2016-09-01 NOTE — Progress Notes (Signed)
   Patient is a 41 year old who was seen the office for her annual exam on February 2018. Please see previous note for detail. Due to patient's morbid obesity and complain of hirsutism she is here for an ultrasound today as well as to have the following blood test drawn: DHEAS, 17 hydroxyprogesterone, and total testosterone. She is a chronic smoker and we'll be starting Chantix this week. She is due to have her Mirena IUD removed in July of this year.  Ultrasound: Uterus measures 7.6 x 5.9 x 3.8 cm with endometrial stripe at 2.6 mm. IUD was seen in the proper position. Right ovary corpus luteum cyst measuring 17 x 15 mm with positive color flow to the periphery. Left ovarian follicle. Negative for the cul-de-sac. Bilateral increase ovarian volume consistent with PCO S numerous follicles bilateral  Assessment/plan: Patient with PCOS will return to the office in July to remove the IUD when she stopped smoking we can put her on a low dose oral contraceptive pill to suppress circulating testosterone levels but I'm not going to do so at the age of 41 and she continues to smoke. She was encouraged to begin her Chantix and cut down her smoking cessation. The following blood work will be drawn today: 17 hydroxy progesterone, DHEAS, and total testosterone. Her recent blood work were all normal with except her LDL was slightly elevated at 129 she was encouraged to exercise regularly and cut down on fried foods. She was also presented on previous visit on bariatric surgery and for her to consider make important to see the general surgeons for such procedure which would be highly recommended in her case.  Greater than 90% of the time was spent calcium coronary in care for this patient reviewing her ultrasound. Time of consultation 10 minutes

## 2016-09-04 LAB — TESTOSTERONE, TOTAL, LC/MS/MS: Testosterone, Total, LC-MS-MS: 43 ng/dL (ref 2–45)

## 2016-12-03 ENCOUNTER — Encounter: Payer: Self-pay | Admitting: Gynecology

## 2016-12-17 ENCOUNTER — Ambulatory Visit: Payer: Managed Care, Other (non HMO) | Admitting: Registered"

## 2017-04-16 ENCOUNTER — Ambulatory Visit (INDEPENDENT_AMBULATORY_CARE_PROVIDER_SITE_OTHER): Payer: Managed Care, Other (non HMO) | Admitting: Obstetrics & Gynecology

## 2017-04-16 ENCOUNTER — Encounter: Payer: Self-pay | Admitting: Obstetrics & Gynecology

## 2017-04-16 VITALS — BP 132/86

## 2017-04-16 DIAGNOSIS — Z30433 Encounter for removal and reinsertion of intrauterine contraceptive device: Secondary | ICD-10-CM

## 2017-04-16 NOTE — Patient Instructions (Signed)
1. Encounter for removal and reinsertion of intrauterine contraceptive device (IUD) Easy removal of Mirena IUD and reinsertion.  No complication.  F/U in 4 weeks for IUD check.  Keven, it was very nice to meet you today!  I will see you again soon.  Colocacin de un dispositivo intrauterino - Cuidados posteriores (Intrauterine Device Insertion, Care After) Siga estas instrucciones durante las prximas semanas. Estas indicaciones le proporcionan informacin general acerca de cmo deber cuidarse despus del procedimiento. El mdico tambin podr darle instrucciones ms especficas. El tratamiento ha sido planificado segn las prcticas mdicas actuales, pero en algunos casos pueden ocurrir problemas. Comunquese con el mdico si tiene algn problema o tiene dudas despus del procedimiento. QU ESPERAR DESPUS DEL PROCEDIMIENTO La insercin del DIU puede causar molestias, como clicos. que deberan mejorar una vez que el DIU est en su lugar. Podr tener sangrado despus del procedimiento. Esto es normal. Vara desde un sangrado ligero durante un par de [employer] un sangrado similar al menstrual. Cuando el DIU est en su lugar, se extender un hilo de 1 a 2pulgadas (2,5 a 5cm) por el cuello del tero en la vagina. El hilo no debera molestarle a usted ni a su pareja. De lo contrario, consulte con su mdico. INSTRUCCIONES PARA EL CUIDADO EN EL HOGAR  Controle su DIU para asegurarse de que est en su lugar, antes de reanudar la actividad sexual. Tiene que sentir los hilos. Si no los siente, algo puede estar mal. El DIU puede haberse salido del tero o ste puede haber sido atravesado (perforado) durante la colocacin. Adems, si los hilos son ms largos, puede significar que el DIU se est saliendo del tero. Si ocurre alguno de [employer], no estar protegida y podr Scientist, research (physical sciences).  Puede volver a Management consultant si no tiene problemas con el DIU. El DIU de cobre se considera  efectivo y funciona de inmediato, si se inserta dentro de los [address]  [address] del inicio del perodo. Ser necesario que utilice un mtodo anticonceptivo adicional durante [address] [place], si el DIU se inserta en algn otro momento del ciclo.  Controle que el DIU sigue en su lugar sintiendo los hilos despus de cada perodo menstrual.  Es posible que necesite tomar analgsicos, como acetaminofeno o ibuprofeno. Tome todos los medicamentos como le indic el mdico.  SOLICITE ATENCIN MDICA SI:  Tiene un sangrado ms abundante o dura ms de un ciclo menstrual normal.  Tiene fiebre.  Siente clicos o dolor abdominal que no se alivian con medicamentos.  Siente dolor abdominal que no parece estar relacionado con el rea en que senta los clicos y Chief Technology Officer anteriormente.  Se siente mareada, inusualmente dbil o se desmaya.  Tiene flujo vaginal u olores anormales.  Siente dolor durante las [employer].  No puede sentir los hilos del DIU o los siente ms largos.  Siente que el DIU est en la abertura del cuello del tero, en la vagina.  Piensa que est embarazada o no tiene su perodo menstrual.  El hilo del DIU est lastimando a su pareja sexual.  ASEGRESE DE QUE:  Comprende estas instrucciones.  Controlar su afeccin.  Recibir ayuda de inmediato si no mejora o si empeora.  Esta informacin no tiene Theme park manager el consejo del mdico. Asegrese de hacerle al mdico cualquier pregunta que tenga. Document Released: 03/31/2012 Document Revised: 04/27/2013 Document Reviewed: 12/26/2012 Elsevier Interactive Patient Education  2017 [employer].

## 2017-04-16 NOTE — Progress Notes (Signed)
    Lindsey [name] 20-May-1976 [id number]        41 y.o.  G2P1011   RP:  Mirena IUD removal/insertion  HPI:  Past medical history,surgical history, problem list, medications, allergies, family history and social history were all reviewed and documented in the EPIC chart.  Directed ROS with pertinent positives and negatives documented in the history of present illness/assessment and plan.  Exam:  Vitals:   04/16/17 1138  BP: 132/86   General appearance:  Normal                                                                    IUD procedure note       Patient presented to the office today for removal and placement of Mirena IUD. The patient had previously been provided with literature information on this method of contraception. The risks benefits and pros and cons were previously discussed and all her questions were answered. She has done very well with the Mirena IUD for the last 5 years.  She is fully aware that this form of contraception is 99% effective and is good for 5 years.  Pelvic exam: Vulva normal Vagina: No lesions or discharge Cervix: No lesions or discharge Uterus: AV position, normal size Adnexa: No masses or tenderness Rectal exam: Not done  The Mirena IUD is easily removed by pulling on the strings with a Boseman clamp.  It is intact and shown to the patient.  The cervix was cleansed with Betadine solution. Hurricane spray.  A single-tooth tenaculum was placed on the anterior cervical lip. The IUD was shown to the patient and inserted in a sterile fashion, hysterometry is a 7 cm. The IUD string was trimmed. The single-tooth tenaculum was removed. Patient was instructed to return back to the office in one month for follow up.       Assessment/Plan:  41 y.o. G2P1011   1. Encounter for removal and reinsertion of intrauterine contraceptive device (IUD) Easy removal of Mirena IUD and reinsertion.  No complication.  F/U in 4 weeks for IUD check.  Genia Del  MD, 11:56 AM 04/16/2017

## 2017-04-29 ENCOUNTER — Encounter: Payer: Self-pay | Admitting: Anesthesiology

## 2017-05-13 ENCOUNTER — Ambulatory Visit: Payer: Managed Care, Other (non HMO) | Admitting: Obstetrics & Gynecology

## 2017-05-26 ENCOUNTER — Ambulatory Visit: Payer: Managed Care, Other (non HMO) | Admitting: Obstetrics & Gynecology

## 2017-05-26 DIAGNOSIS — Z0289 Encounter for other administrative examinations: Secondary | ICD-10-CM

## 2017-09-28 ENCOUNTER — Ambulatory Visit: Payer: Managed Care, Other (non HMO) | Admitting: Women's Health

## 2017-09-28 ENCOUNTER — Encounter: Payer: Self-pay | Admitting: Women's Health

## 2017-09-28 VITALS — BP 130/80

## 2017-09-28 DIAGNOSIS — N898 Other specified noninflammatory disorders of vagina: Secondary | ICD-10-CM

## 2017-09-28 DIAGNOSIS — R300 Dysuria: Secondary | ICD-10-CM

## 2017-09-28 LAB — WET PREP FOR TRICH, YEAST, CLUE

## 2017-09-28 MED ORDER — FLUCONAZOLE 150 MG PO TABS: 150.0000 mg | ORAL_TABLET | Freq: Once | ORAL | 1 refills | Status: AC

## 2017-09-28 NOTE — Patient Instructions (Signed)

## 2017-09-28 NOTE — Progress Notes (Signed)
42 y.o SWF G2P1 presents with complaint of unbearable vaginal itching for four days.  Denies pain with urinating, vaginal discharge, abdominal pain, fever, nausea, or vomiting. Used OTC anti-itch cream with some relief. Sexually active/ same partner. 03/2017 Mirena IUD, amennorrhia. History of PCO S   Exam: Appears well, obese. Abdomen soft with no tenderness. External genitalia, mild erythema at introitus. Speculum exam, mild erythema, no discharge, no odor noted. Wet prep negative. IUD strings not visible.  Clinical Candidal vulvovaginits   Plan: Fluconazole 150 mg was prescribed. Candidal vulvovaginits prevention discussed, and instructed to call if continued problem. Keep scheduled annual exam visit

## 2017-09-30 ENCOUNTER — Other Ambulatory Visit: Payer: Self-pay | Admitting: Women's Health

## 2017-09-30 ENCOUNTER — Telehealth: Payer: Self-pay | Admitting: *Deleted

## 2017-09-30 MED ORDER — SULFAMETHOXAZOLE-TRIMETHOPRIM 800-160 MG PO TABS: 1.0000 | ORAL_TABLET | Freq: Two times a day (BID) | ORAL | 0 refills | Status: DC

## 2017-09-30 NOTE — Telephone Encounter (Signed)
TC states 75% better with Diflucan, will repeat, now having burning at initiation of stream and end of sream of urination, frequency, urgency.Septra twice daily for 3 days. Instructed to call if continued problems.

## 2017-09-30 NOTE — Telephone Encounter (Signed)
Pt called to follow up from OV 09/28/17 states still have vaginal itching internally/externally now has burning with urination as well. Please advise

## 2017-10-30 DIAGNOSIS — L709 Acne, unspecified: Secondary | ICD-10-CM | POA: Diagnosis not present

## 2017-10-30 DIAGNOSIS — Z5181 Encounter for therapeutic drug level monitoring: Secondary | ICD-10-CM | POA: Diagnosis not present

## 2017-12-10 DIAGNOSIS — L709 Acne, unspecified: Secondary | ICD-10-CM | POA: Diagnosis not present

## 2017-12-10 DIAGNOSIS — Z5181 Encounter for therapeutic drug level monitoring: Secondary | ICD-10-CM | POA: Diagnosis not present

## 2017-12-16 LAB — URINE CULTURE
MICRO NUMBER: 90307080
Result:: NO GROWTH
SPECIMEN QUALITY: ADEQUATE

## 2017-12-16 LAB — URINALYSIS, COMPLETE W/RFL CULTURE

## 2017-12-16 LAB — NO CULTURE INDICATED

## 2017-12-25 ENCOUNTER — Encounter: Payer: Self-pay | Admitting: Obstetrics & Gynecology

## 2017-12-25 ENCOUNTER — Ambulatory Visit (INDEPENDENT_AMBULATORY_CARE_PROVIDER_SITE_OTHER): Payer: 59 | Admitting: Obstetrics & Gynecology

## 2017-12-25 VITALS — BP 126/84 | Ht 65.0 in | Wt 231.0 lb

## 2017-12-25 DIAGNOSIS — E6609 Other obesity due to excess calories: Secondary | ICD-10-CM

## 2017-12-25 DIAGNOSIS — Z6838 Body mass index (BMI) 38.0-38.9, adult: Secondary | ICD-10-CM

## 2017-12-25 DIAGNOSIS — Z01419 Encounter for gynecological examination (general) (routine) without abnormal findings: Secondary | ICD-10-CM | POA: Diagnosis not present

## 2017-12-25 DIAGNOSIS — Z30431 Encounter for routine checking of intrauterine contraceptive device: Secondary | ICD-10-CM | POA: Diagnosis not present

## 2017-12-25 DIAGNOSIS — F17210 Nicotine dependence, cigarettes, uncomplicated: Secondary | ICD-10-CM | POA: Diagnosis not present

## 2017-12-25 NOTE — Progress Notes (Signed)
Lindsey [name] 07/04/1976 [id number]   History:    42 y.o. 302P1A1L1 Married  RP:  Established patient presenting for annual gyn exam   HPI: Well on Mirena IUD post insertion September 2018, but frequent light breakthrough bleeding.  No pelvic pain.  Normal vaginal secretions.  No pain with intercourse.  Urine and bowel movements normal.  Breasts normal.  Body mass index 38.44.  Started decreasing carbs and portions for weight loss.  Works in the yard.  Health labs with family physician.  Past medical history,surgical history, family history and social history were all reviewed and documented in the EPIC chart.  Gynecologic History No LMP recorded. (Menstrual status: IUD). Contraception: Mirena IUD x 03/2017 Last Pap: 04/2015. Results were: Negative, HPV HR neg Last mammogram: Never Bone Density: Never Colonoscopy: Never  Obstetric History OB History  Gravida Para Term Preterm AB Living  2 1 1   1 1   SAB TAB Ectopic Multiple Live Births          1    # Outcome Date GA Lbr Len/2nd Weight Sex Delivery Anes PTL Lv  2 AB           1 Term     F CS-Unspec  N LIV     ROS: A ROS was performed and pertinent positives and negatives are included in the history.  GENERAL: No fevers or chills. HEENT: No change in vision, no earache, sore throat or sinus congestion. NECK: No pain or stiffness. CARDIOVASCULAR: No chest pain or pressure. No palpitations. PULMONARY: No shortness of breath, cough or wheeze. GASTROINTESTINAL: No abdominal pain, nausea, vomiting or diarrhea, melena or bright red blood per rectum. GENITOURINARY: No urinary frequency, urgency, hesitancy or dysuria. MUSCULOSKELETAL: No joint or muscle pain, no back pain, no recent trauma. DERMATOLOGIC: No rash, no itching, no lesions. ENDOCRINE: No polyuria, polydipsia, no heat or cold intolerance. No recent change in weight. HEMATOLOGICAL: No anemia or easy bruising or bleeding. NEUROLOGIC: No headache, seizures, numbness, tingling or  weakness. PSYCHIATRIC: No depression, no loss of interest in normal activity or change in sleep pattern.     Exam:   BP 126/84   Ht 5\' 5"  (1.651 m)   Wt 231 lb (104.8 kg)   BMI 38.44 kg/m   Body mass index is 38.44 kg/m.  General appearance : Well developed well nourished female. No acute distress HEENT: Eyes: no retinal hemorrhage or exudates,  Neck supple, trachea midline, no carotid bruits, no thyroidmegaly Lungs: Clear to auscultation, no rhonchi or wheezes, or rib retractions  Heart: Regular rate and rhythm, no murmurs or gallops Breast:Examined in sitting and supine position were symmetrical in appearance, no palpable masses or tenderness,  no skin retraction, no nipple inversion, no nipple discharge, no skin discoloration, no axillary or supraclavicular lymphadenopathy Abdomen: no palpable masses or tenderness, no rebound or guarding Extremities: no edema or skin discoloration or tenderness  Pelvic: Vulva: Normal             Vagina: No gross lesions or discharge  Cervix: No gross lesions or discharge.  Pap reflex done.  IUD strings seen  Uterus  AV, normal size, shape and consistency, non-tender and mobile  Adnexa  Without masses or tenderness  Anus: Normal   Assessment/Plan:  42 y.o. female for annual exam   1. Encounter for routine gynecological examination with Papanicolaou smear of cervix Normal gynecologic exam.  Pap reflex done today.  Breast exam normal.  Will schedule first screening mammogram now.  Health labs with family physician.  2. Encounter for routine checking of intrauterine contraceptive device (IUD) Marinol IUD in good position and well-tolerated.  Patient prefers to observe the breakthrough bleeding at this time.  Progestin only pill discussed with patient and will call to have it prescribed as needed in the future.  3. Cigarette smoker Strongly recommended to quit smoking.  Given that patient has had a hard time succeeding in the past, recommend  slowly decreasing the number of cigarettes smoked every week, until she reaches a level where she can stop completely.  4. Class 2 obesity due to excess calories without serious comorbidity with body mass index (BMI) of 38.0 to 38.9 in adult Working on weight loss currently by decreasing the portions and decreasing the amount of carbs.  Recommend aerobic physical activity 5 times a week and weightlifting every 2 days.  Counseling on above issues and coordination of care more than 50% for 10 minutes.   Genia Del MD, 10:25 AM 12/25/2017

## 2017-12-25 NOTE — Patient Instructions (Addendum)
1. Encounter for routine gynecological examination with Papanicolaou smear of cervix Normal gynecologic exam.  Pap reflex done today.  Breast exam normal.  Will schedule first screening mammogram now.  Health labs with family physician.  2. Encounter for routine checking of intrauterine contraceptive device (IUD) Marinol IUD in good position and well-tolerated.  Patient prefers to observe the breakthrough bleeding at this time.  Progestin only pill discussed with patient and will call to have it prescribed as needed in the future.  3. Cigarette smoker Strongly recommended to quit smoking.  Given that patient has had a hard time succeeding in the past, recommend slowly decreasing the number of cigarettes smoked every week, until she reaches a level where she can stop completely.  4. Class 2 obesity due to excess calories without serious comorbidity with body mass index (BMI) of 38.0 to 38.9 in adult Working on weight loss currently by decreasing the portions and decreasing the amount of carbs.  Recommend aerobic physical activity 5 times a week and weightlifting every 2 days.  [name], it was a pleasure seeing you today!  I will inform you of your results as soon as they are available.

## 2017-12-28 LAB — PAP IG W/ RFLX HPV ASCU

## 2018-01-04 DIAGNOSIS — H40053 Ocular hypertension, bilateral: Secondary | ICD-10-CM | POA: Diagnosis not present

## 2018-01-13 DIAGNOSIS — L709 Acne, unspecified: Secondary | ICD-10-CM | POA: Diagnosis not present

## 2018-01-13 DIAGNOSIS — Z5181 Encounter for therapeutic drug level monitoring: Secondary | ICD-10-CM | POA: Diagnosis not present

## 2018-01-13 DIAGNOSIS — K130 Diseases of lips: Secondary | ICD-10-CM | POA: Diagnosis not present

## 2018-01-13 DIAGNOSIS — L700 Acne vulgaris: Secondary | ICD-10-CM | POA: Diagnosis not present

## 2018-02-16 DIAGNOSIS — K130 Diseases of lips: Secondary | ICD-10-CM | POA: Diagnosis not present

## 2018-02-16 DIAGNOSIS — L709 Acne, unspecified: Secondary | ICD-10-CM | POA: Diagnosis not present

## 2018-02-16 DIAGNOSIS — Z5181 Encounter for therapeutic drug level monitoring: Secondary | ICD-10-CM | POA: Diagnosis not present

## 2018-05-07 ENCOUNTER — Other Ambulatory Visit: Payer: Self-pay | Admitting: Psychiatry

## 2018-05-10 ENCOUNTER — Other Ambulatory Visit: Payer: Self-pay | Admitting: Psychiatry

## 2018-05-19 ENCOUNTER — Encounter: Payer: Self-pay | Admitting: Psychiatry

## 2018-05-19 ENCOUNTER — Ambulatory Visit: Payer: 59 | Admitting: Psychiatry

## 2018-05-19 DIAGNOSIS — F4001 Agoraphobia with panic disorder: Secondary | ICD-10-CM

## 2018-05-19 DIAGNOSIS — F3181 Bipolar II disorder: Secondary | ICD-10-CM | POA: Diagnosis not present

## 2018-05-19 DIAGNOSIS — F17200 Nicotine dependence, unspecified, uncomplicated: Secondary | ICD-10-CM | POA: Diagnosis not present

## 2018-05-19 DIAGNOSIS — F4310 Post-traumatic stress disorder, unspecified: Secondary | ICD-10-CM | POA: Diagnosis not present

## 2018-05-19 NOTE — Progress Notes (Signed)
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 DagoCraLucia GaskinsButler DenmIndaLenox Hill Hospital35WallacGaEndoscopy Center Of Northwest Connectic59u(671)Marland KitcheHyacintCh47GuarCallaway DisEarna CodRayneLindie Spr96045CreB6Craige Cot56tar17Taw23Ar2Verlon 70AProvidence Tarzana Medical CenteOrthoariz[facility] Gilbe78Denyse DagoCraigeAtlantic Coastal Surgery CenLysle Pearll GuitarTAG>

## 2018-05-28 DIAGNOSIS — R5383 Other fatigue: Secondary | ICD-10-CM | POA: Diagnosis not present

## 2018-05-28 DIAGNOSIS — R6883 Chills (without fever): Secondary | ICD-10-CM | POA: Diagnosis not present

## 2018-05-28 DIAGNOSIS — Z23 Encounter for immunization: Secondary | ICD-10-CM | POA: Diagnosis not present

## 2018-07-22 ENCOUNTER — Encounter: Payer: Self-pay | Admitting: Emergency Medicine

## 2018-07-22 DIAGNOSIS — F3181 Bipolar II disorder: Secondary | ICD-10-CM

## 2018-08-06 ENCOUNTER — Other Ambulatory Visit: Payer: Self-pay | Admitting: Psychiatry

## 2018-08-16 ENCOUNTER — Ambulatory Visit: Payer: 59 | Admitting: Psychiatry

## 2018-08-16 ENCOUNTER — Encounter: Payer: Self-pay | Admitting: Psychiatry

## 2018-08-16 DIAGNOSIS — F17200 Nicotine dependence, unspecified, uncomplicated: Secondary | ICD-10-CM

## 2018-08-16 DIAGNOSIS — F4310 Post-traumatic stress disorder, unspecified: Secondary | ICD-10-CM | POA: Diagnosis not present

## 2018-08-16 DIAGNOSIS — F3181 Bipolar II disorder: Secondary | ICD-10-CM

## 2018-08-16 DIAGNOSIS — F4001 Agoraphobia with panic disorder: Secondary | ICD-10-CM

## 2018-08-16 MED ORDER — PROPRANOLOL HCL 10 MG PO TABS: 30.0000 mg | ORAL_TABLET | Freq: Two times a day (BID) | ORAL | 1 refills | Status: DC
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
## 2018-08-24 ENCOUNTER — Telehealth: Payer: Self-pay | Admitting: Psychiatry

## 2018-08-24 NOTE — Telephone Encounter (Signed)
Pt called. Stated last visit with Dr. Jennelle Human he ask if she noticed a change in work performance. She stated no. 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    FYI   FOR NEXT  VISIT

## 2018-08-24 NOTE — Telephone Encounter (Signed)
FYI

## 2018-09-08 DIAGNOSIS — R51 Headache: Secondary | ICD-10-CM | POA: Diagnosis not present

## 2018-09-17 ENCOUNTER — Other Ambulatory Visit: Payer: Self-pay | Admitting: Psychiatry

## 2018-09-28 ENCOUNTER — Other Ambulatory Visit: Payer: Self-pay | Admitting: Psychiatry

## 2018-10-15 ENCOUNTER — Other Ambulatory Visit: Payer: Self-pay | Admitting: Psychiatry

## 2018-10-23 ENCOUNTER — Other Ambulatory Visit: Payer: Self-pay | Admitting: Psychiatry

## 2018-11-15 ENCOUNTER — Ambulatory Visit (INDEPENDENT_AMBULATORY_CARE_PROVIDER_SITE_OTHER): Payer: 59 | Admitting: Psychiatry

## 2018-11-15 ENCOUNTER — Other Ambulatory Visit: Payer: Self-pay

## 2018-11-15 ENCOUNTER — Encounter: Payer: Self-pay | Admitting: Psychiatry

## 2018-11-15 DIAGNOSIS — F5105 Insomnia due to other mental disorder: Secondary | ICD-10-CM

## 2018-11-15 DIAGNOSIS — F4001 Agoraphobia with panic disorder: Secondary | ICD-10-CM | POA: Diagnosis not present

## 2018-11-15 DIAGNOSIS — F411 Generalized anxiety disorder: Secondary | ICD-10-CM

## 2018-11-15 DIAGNOSIS — F308 Other manic episodes: Secondary | ICD-10-CM | POA: Diagnosis not present

## 2018-11-15 MED ORDER — OXCARBAZEPINE 300 MG PO TABS: ORAL_TABLET | ORAL | 1 refills | Status: DC
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
## 2018-12-08 ENCOUNTER — Other Ambulatory Visit: Payer: Self-pay | Admitting: Psychiatry

## 2018-12-09 ENCOUNTER — Other Ambulatory Visit: Payer: Self-pay | Admitting: Psychiatry

## 2018-12-27 ENCOUNTER — Other Ambulatory Visit: Payer: Self-pay

## 2018-12-27 ENCOUNTER — Ambulatory Visit: Payer: 59 | Admitting: Psychiatry

## 2018-12-28 ENCOUNTER — Ambulatory Visit: Payer: 59 | Admitting: Psychiatry

## 2018-12-28 ENCOUNTER — Other Ambulatory Visit: Payer: Self-pay

## 2018-12-28 ENCOUNTER — Encounter: Payer: Self-pay | Admitting: Psychiatry

## 2018-12-28 ENCOUNTER — Ambulatory Visit (INDEPENDENT_AMBULATORY_CARE_PROVIDER_SITE_OTHER): Payer: 59 | Admitting: Psychiatry

## 2018-12-28 DIAGNOSIS — F3181 Bipolar II disorder: Secondary | ICD-10-CM | POA: Diagnosis not present

## 2018-12-28 DIAGNOSIS — F4001 Agoraphobia with panic disorder: Secondary | ICD-10-CM

## 2018-12-28 DIAGNOSIS — F411 Generalized anxiety disorder: Secondary | ICD-10-CM | POA: Diagnosis not present

## 2018-12-28 DIAGNOSIS — F4310 Post-traumatic stress disorder, unspecified: Secondary | ICD-10-CM | POA: Diagnosis not present

## 2018-12-28 DIAGNOSIS — F5105 Insomnia due to other mental disorder: Secondary | ICD-10-CM

## 2018-12-28 MED ORDER — OXCARBAZEPINE 150 MG PO TABS: 150.0000 mg | ORAL_TABLET | Freq: Two times a day (BID) | ORAL | 2 refills | Status: DC

## 2018-12-28 NOTE — Progress Notes (Signed)
Lindsey [name] [id number] 02/07/1976 43 y.o.   Subjective:   Patient ID:  Lindsey [name] is a 43 y.o. (DOB 02/22/1976) female.  Chief Complaint:  Chief Complaint  Patient presents with  . Follow-up    Medication Management and mania  . Depression    Medication Management    Depression         Associated symptoms include no decreased concentration and no suicidal ideas.   Lindsey [name] presents to today for follow-up of mood and anxiety.  Last seen April 27th, 2020.  She was hypomanic at the time and having insomnia.  We increased Xanax 1 mg nightly and she was encouraged to increase oxcarbazepine to 600 mg nightly to help with the hypomania.  She is fairly sensitive to mood stabilizers and has failed several.  Hypomania resolved.  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  Finally ended the trauma bond and is better.  Trileptal and Xanax at night is better but only taking 1/2 of 300mg  Trileptal bc sleepiness from 300 mg hs.  She's taking Xanax 0.5 mg AM and HS.  Getting a lot done.  Problematic boss got fired for performance problems.    Still some anxiety and occ trouble sleeping and struggling with insomnia and mood stability.  More stable lately.  "I feel so stable".  Can still be sad without depression.  Usually 7 hours lately.  Rested when gets up.  Doesn't know when return to office so enjoying what she gets right now.  No panic lately.  Inderall helped anxiety a whole lot.  Xanax more calming than the others.  Last hypomania was November 2018  Past Psychiatric Medication Trials: Poor response SSRIs, Vraylar, lamotrigine, propranolol, Xanax, Latuda, olanzapine sedation, Klonopin, Ativan, Xanax, buspirone nausea NAC, Abilify, lithium, Latuda fogginess, risperidone, Effexor, Paxil, sertraline itchy, Wellbutrin, Serzone, Strattera nausea, olanzapine  Review of Systems:  Review of Systems  Cardiovascular: Negative for palpitations.  Neurological:  Negative for tremors and weakness.  Psychiatric/Behavioral: Positive for depression. Negative for agitation, behavioral problems, confusion, decreased concentration, dysphoric mood, hallucinations, self-injury, sleep disturbance and suicidal ideas. The patient is hyperactive. The patient is not nervous/anxious.     Medications: I have reviewed the patient's current medications.  Current Outpatient Medications  Medication Sig Dispense Refill  . ALPRAZolam (XANAX) 0.5 MG tablet TAKE 1 TABLET BY MOUTH EVERY 6 HOURS AS NEEDED 90 tablet 1  . lamoTRIgine (LAMICTAL) 150 MG tablet TAKE 1 TABLET BY MOUTH EVERY MORNING AND 2 TABLETS BY MOUTH EVERY EVENING 270 tablet 0  . levonorgestrel (MIRENA) 20 MCG/24HR IUD 1 each by Intrauterine route once.    . propranolol (INDERAL) 10 MG tablet TAKE 3 TABLETS (30 MG TOTAL) BY MOUTH 2 (TWO) TIMES DAILY. 100 tablet 1  . OXcarbazepine (TRILEPTAL) 150 MG tablet Take 1 tablet (150 mg total) by mouth 2 (two) times daily. 60 tablet 2   No current facility-administered medications for this visit.     Medication Side Effects: None  Allergies: No Known Allergies  Past Medical History:  Diagnosis Date  . Other and unspecified disc disorder of lumbar region    torn lumbar disc?    Family History  Problem Relation Age of Onset  . Diabetes Mother   . Diabetes Father   . Cancer Father        kidney  . Cancer Paternal Grandfather        PROSTATE    Social History   Socioeconomic History  .  Marital status: Single    Spouse name: Not on file  . Number of children: Not on file  . Years of education: Not on file  . Highest education level: Not on file  Occupational History  . Not on file  Social Needs  . Financial resource strain: Not on file  . Food insecurity:    Worry: Not on file    Inability: Not on file  . Transportation needs:    Medical: Not on file    Non-medical: Not on file  Tobacco Use  . Smoking status: Current Every Day Smoker     Packs/day: 0.75  . Smokeless tobacco: Never Used  Substance and Sexual Activity  . Alcohol use: No  . Drug use: No  . Sexual activity: Not Currently    Birth control/protection: I.U.D.    Comment: 1st intercourse- 24, partners- 5   Lifestyle  . Physical activity:    Days per week: Not on file    Minutes per session: Not on file  . Stress: Not on file  Relationships  . Social connections:    Talks on phone: Not on file    Gets together: Not on file    Attends religious service: Not on file    Active member of club or organization: Not on file    Attends meetings of clubs or organizations: Not on file    Relationship status: Not on file  . Intimate partner violence:    Fear of current or ex partner: Not on file    Emotionally abused: Not on file    Physically abused: Not on file    Forced sexual activity: Not on file  Other Topics Concern  . Not on file  Social History Narrative  . Not on file    Past Medical History, Surgical history, Social history, and Family history were reviewed and updated as appropriate.   Please see review of systems for further details on the patient's review from today.   Objective:   Physical Exam:  There were no vitals taken for this visit.  Physical Exam Neurological:     Mental Status: She is alert and oriented to person, place, and time.     Cranial Nerves: No dysarthria.  Psychiatric:        Attention and Perception: Attention normal.        Mood and Affect: Mood is elated.        Speech: Speech normal. Speech is not rapid and pressured, slurred or tangential.        Behavior: Behavior is not agitated or aggressive. Behavior is cooperative.        Thought Content: Thought content normal. Thought content is not paranoid or delusional. Thought content does not include homicidal or suicidal ideation. Thought content does not include homicidal or suicidal plan.        Cognition and Memory: Cognition and memory normal.        Judgment:  Judgment normal.     Comments: She acknowledges hypomania.  Her speech is not pressured.  She has good insight into the hypomania.     Lab Review:     Component Value Date/Time   NA 140 08/18/2016 0838   K 4.1 08/18/2016 0838   CL 106 08/18/2016 0838   CO2 27 08/18/2016 0838   GLUCOSE 93 08/18/2016 0838   BUN 8 08/18/2016 0838   CREATININE 0.68 08/18/2016 0838   CALCIUM 9.2 08/18/2016 0838   PROT 6.8 08/18/2016 0838   ALBUMIN  4.2 08/18/2016 0838   AST 17 08/18/2016 0838   ALT 29 08/18/2016 0838   ALKPHOS 65 08/18/2016 0838   BILITOT 0.3 08/18/2016 0838       Component Value Date/Time   WBC 7.3 08/18/2016 0838   RBC 4.57 08/18/2016 0838   HGB 14.2 08/18/2016 0838   HCT 41.0 08/18/2016 0838   PLT 278 08/18/2016 0838   MCV 89.7 08/18/2016 0838   MCH 31.1 08/18/2016 0838   MCHC 34.6 08/18/2016 0838   RDW 13.2 08/18/2016 0838   LYMPHSABS 2,044 08/18/2016 0838   MONOABS 292 08/18/2016 0838   EOSABS 511 (H) 08/18/2016 0838   BASOSABS 73 08/18/2016 0838    No results found for: POCLITH, LITHIUM   No results found for: PHENYTOIN, PHENOBARB, VALPROATE, CBMZ   .res Assessment: Plan:    Bipolar II disorder (HCC) - Plan: OXcarbazepine (TRILEPTAL) 150 MG tablet  Generalized anxiety disorder  Panic disorder with agoraphobia  PTSD (post-traumatic stress disorder)  Insomnia due to mental condition  Medsensitive.  Lindsey [name] has a long history of bipolar disorder.  Usually manic episodes are mild.  Her mother also has bipolar disorder.  She has failed numerous mood stabilizers as noted above.  She is now hypomanic with no particular trigger.  This may be associated with a tendency toward spring hypomania and bipolar patients.  The propranolol was very helpful for her anxiety.  It also controlled the palpitations.  We discussed the short-term risks associated with benzodiazepines including sedation and increased fall risk among others.  Discussed long-term side effect risk  including dependence, potential withdrawal symptoms, and the potential eventual dose-related risk of dementia.  Xanax helped a lot with normal and social anxiety. Continue Xanax and propranolol prn.  Patient needs to sleep in order to control the hypomania.  Supportive therapy on managing the hypomania and resisting urges to spend excessively or risk-taking behaviors until the symptoms are under good control.  Be careful with caffeine bc anxiety.  Getting benefit from very low dose oxcarbazapine 150mg  HS. This is off label for bipolar mania or hypomania.  She is fairly sensitive to mood stabilizers historically.  She does not want weight gain.  That limits options given the failures noted above.  Discussed side effects in detail.  She agrees to this plan.  If the increase in Xanax does not address the insomnia call back.  If there are side effects from the oxcarbazepine or it seems an inadequate to control hypomania call back.  FU 3 mos.  Meredith Staggersarey Cottle, MD, DFAPA     Please see After Visit Summary for patient specific instructions.  Future Appointments  Date Time Provider Department Center  01/18/2019 12:30 PM Genia DelLavoie, Marie[place]ne, MD [facility]  02/23/2019  3:30 PM Cottle, Steva Readyarey G Jr., MD [facility] None    No orders of the defined types were placed in this encounter.     -------------------------------

## 2018-12-29 ENCOUNTER — Encounter: Payer: 59 | Admitting: Obstetrics & Gynecology

## 2019-01-18 ENCOUNTER — Encounter: Payer: 59 | Admitting: Obstetrics & Gynecology

## 2019-01-20 ENCOUNTER — Other Ambulatory Visit: Payer: Self-pay | Admitting: Psychiatry

## 2019-02-10 ENCOUNTER — Other Ambulatory Visit: Payer: Self-pay | Admitting: Psychiatry

## 2019-02-18 ENCOUNTER — Other Ambulatory Visit: Payer: Self-pay

## 2019-02-21 ENCOUNTER — Ambulatory Visit (INDEPENDENT_AMBULATORY_CARE_PROVIDER_SITE_OTHER): Payer: 59 | Admitting: Obstetrics & Gynecology

## 2019-02-21 ENCOUNTER — Encounter: Payer: Self-pay | Admitting: Obstetrics & Gynecology

## 2019-02-21 ENCOUNTER — Other Ambulatory Visit: Payer: Self-pay

## 2019-02-21 VITALS — BP 128/84 | Ht 65.0 in | Wt 226.0 lb

## 2019-02-21 DIAGNOSIS — E6609 Other obesity due to excess calories: Secondary | ICD-10-CM

## 2019-02-21 DIAGNOSIS — Z01419 Encounter for gynecological examination (general) (routine) without abnormal findings: Secondary | ICD-10-CM

## 2019-02-21 DIAGNOSIS — Z6837 Body mass index (BMI) 37.0-37.9, adult: Secondary | ICD-10-CM | POA: Diagnosis not present

## 2019-02-21 DIAGNOSIS — Z30431 Encounter for routine checking of intrauterine contraceptive device: Secondary | ICD-10-CM | POA: Diagnosis not present

## 2019-02-21 NOTE — Progress Notes (Signed)
Lindsey [name] 04/19/1976 [id number]   History:    43 y.o. G2P1A1L1 Single  RP:  Established patient presenting for annual gyn exam   HPI: Well on Mirena IUD x 03/2017.  Marland Kitchen.  No pelvic pain.  Abstinent.  Urine and bowel movements normal.  Breasts normal.  Body mass index 37.61.  Lost 5 pounds since last year.  Works in the yard.  Health labs with family physician.  Past medical history,surgical history, family history and social history were all reviewed and documented in the EPIC chart.  Gynecologic History No LMP recorded. (Menstrual status: IUD). Contraception: Mirena IUD x 03/2017 Last Pap: 12/2017. Results were: Negative Last mammogram: Never Bone Density: Never Colonoscopy: Never  Obstetric History OB History  Gravida Para Term Preterm AB Living  2 1 1   1 1   SAB TAB Ectopic Multiple Live Births          1    # Outcome Date GA Lbr Len/2nd Weight Sex Delivery Anes PTL Lv  2 AB           1 Term     F CS-Unspec  N LIV     ROS: A ROS was performed and pertinent positives and negatives are included in the history.  GENERAL: No fevers or chills. HEENT: No change in vision, no earache, sore throat or sinus congestion. NECK: No pain or stiffness. CARDIOVASCULAR: No chest pain or pressure. No palpitations. PULMONARY: No shortness of breath, cough or wheeze. GASTROINTESTINAL: No abdominal pain, nausea, vomiting or diarrhea, melena or bright red blood per rectum. GENITOURINARY: No urinary frequency, urgency, hesitancy or dysuria. MUSCULOSKELETAL: No joint or muscle pain, no back pain, no recent trauma. DERMATOLOGIC: No rash, no itching, no lesions. ENDOCRINE: No polyuria, polydipsia, no heat or cold intolerance. No recent change in weight. HEMATOLOGICAL: No anemia or easy bruising or bleeding. NEUROLOGIC: No headache, seizures, numbness, tingling or weakness. PSYCHIATRIC: No depression, no loss of interest in normal activity or change in sleep pattern.     Exam:   BP 128/84   Ht 5'  5" (1.651 m)   Wt 226 lb (102.5 kg)   BMI 37.61 kg/m   Body mass index is 37.61 kg/m.  General appearance : Well developed well nourished female. No acute distress HEENT: Eyes: no retinal hemorrhage or exudates,  Neck supple, trachea midline, no carotid bruits, no thyroidmegaly Lungs: Clear to auscultation, no rhonchi or wheezes, or rib retractions  Heart: Regular rate and rhythm, no murmurs or gallops Breast:Examined in sitting and supine position were symmetrical in appearance, no palpable masses or tenderness,  no skin retraction, no nipple inversion, no nipple discharge, no skin discoloration, no axillary or supraclavicular lymphadenopathy Abdomen: no palpable masses or tenderness, no rebound or guarding Extremities: no edema or skin discoloration or tenderness  Pelvic: Vulva: Normal             Vagina: No gross lesions or discharge  Cervix: No gross lesions or discharge.  [facility]  Uterus  AV, normal size, shape and consistency, non-tender and mobile  Adnexa  Without masses or tenderness  Anus: Normal   Assessment/Plan:  43 y.o. female for annual exam   1. Well female exam with routine gynecological exam Normal gynecologic exam.  Pap test June 2019 was negative, no indication to repeat this year.  Breast exam normal.  Will schedule screening mammogram now.  Health labs with family physician.  2. Encounter for routine checking of intrauterine contraceptive  device (IUD) Mirena IUD since September 2018 well-tolerated and in good position.  3. Class 2 obesity due to excess calories without serious comorbidity with body mass index (BMI) of 37.0 to 37.9 in adult Body mass index at 37.61.  Lost 5 pounds since last year.  Low calorie/carb diet recommended, such as [employer].  Aerobic physical activities 5 times a week and weightlifting every 2 days.  Princess Bruins MD, 4:19 PM 02/21/2019

## 2019-02-23 ENCOUNTER — Ambulatory Visit: Payer: 59 | Admitting: Psychiatry

## 2019-02-28 ENCOUNTER — Encounter: Payer: Self-pay | Admitting: Obstetrics & Gynecology

## 2019-02-28 NOTE — Patient Instructions (Signed)
1. Well female exam with routine gynecological exam Normal gynecologic exam.  Pap test June 2019 was negative, no indication to repeat this year.  Breast exam normal.  Will schedule screening mammogram now.  Health labs with family physician.  2. Encounter for routine checking of intrauterine contraceptive device (IUD) Mirena IUD since September 2018 well-tolerated and in good position.  3. Class 2 obesity due to excess calories without serious comorbidity with body mass index (BMI) of 37.0 to 37.9 in adult Body mass index at 37.61.  Lost 5 pounds since last year.  Low calorie/carb diet recommended, such as [employer].  Aerobic physical activities 5 times a week and weightlifting every 2 days.  Srija, it was a pleasure seeing you today!

## 2019-03-11 ENCOUNTER — Other Ambulatory Visit: Payer: Self-pay | Admitting: Psychiatry

## 2019-03-30 ENCOUNTER — Ambulatory Visit: Payer: 59 | Admitting: Psychiatry

## 2019-04-01 ENCOUNTER — Other Ambulatory Visit: Payer: Self-pay | Admitting: Psychiatry

## 2019-04-17 ENCOUNTER — Other Ambulatory Visit: Payer: Self-pay | Admitting: Psychiatry

## 2019-04-29 ENCOUNTER — Encounter: Payer: Self-pay | Admitting: Psychiatry

## 2019-04-29 ENCOUNTER — Other Ambulatory Visit: Payer: Self-pay

## 2019-04-29 ENCOUNTER — Other Ambulatory Visit: Payer: Self-pay | Admitting: Psychiatry

## 2019-04-29 ENCOUNTER — Ambulatory Visit (INDEPENDENT_AMBULATORY_CARE_PROVIDER_SITE_OTHER): Payer: 59 | Admitting: Psychiatry

## 2019-04-29 DIAGNOSIS — F17200 Nicotine dependence, unspecified, uncomplicated: Secondary | ICD-10-CM

## 2019-04-29 DIAGNOSIS — F5105 Insomnia due to other mental disorder: Secondary | ICD-10-CM

## 2019-04-29 DIAGNOSIS — F4001 Agoraphobia with panic disorder: Secondary | ICD-10-CM | POA: Diagnosis not present

## 2019-04-29 DIAGNOSIS — F411 Generalized anxiety disorder: Secondary | ICD-10-CM

## 2019-04-29 DIAGNOSIS — F4310 Post-traumatic stress disorder, unspecified: Secondary | ICD-10-CM

## 2019-04-29 DIAGNOSIS — F308 Other manic episodes: Secondary | ICD-10-CM

## 2019-04-29 DIAGNOSIS — F3181 Bipolar II disorder: Secondary | ICD-10-CM | POA: Diagnosis not present

## 2019-04-29 NOTE — Progress Notes (Signed)
Lindsey [name] [id number] 01-May-1976 43 y.o.  Virtual Visit via Telephone Note  I connected with pt by telephone and verified that I am speaking with the correct person using two identifiers.   I discussed the limitations, risks, security and privacy concerns of performing an evaluation and management service by telephone and the availability of in person appointments. I also discussed with the patient that there may be a patient responsible charge related to this service. The patient expressed understanding and agreed to proceed.  I discussed the assessment and treatment plan with the patient. The patient was provided an opportunity to ask questions and all were answered. The patient agreed with the plan and demonstrated an understanding of the instructions.   The patient was advised to call back or seek an in-person evaluation if the symptoms worsen or if the condition fails to improve as anticipated.  I provided 22 minutes of non-face-to-face time during this encounter. The call started at 1030 and ended at 17. The patient was located at home and the provider was located office.  Subjective:   Patient ID:  Lindsey [name] is a 43 y.o. (DOB 07/17/1976) female.  Chief Complaint:  Chief Complaint  Patient presents with  . Follow-up    bipolar and anxiety  . Manic Behavior    Depression        Associated symptoms include no decreased concentration and no suicidal ideas.   Lindsey [name] presents to today for follow-up of mood and anxiety.  When seen April 27th, 2020.  She was hypomanic at the time and having insomnia.  We increased Xanax 1 mg nightly and she was encouraged to increase oxcarbazepine to 600 mg nightly to help with the hypomania.  She is fairly sensitive to mood stabilizers and has failed several.  She returned in June reporting hypomania resolved.  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  Finally ended the trauma bond and is better.  Trileptal  and Xanax at night is better but only taking 1/2 of 300mg  Trileptal bc sleepiness from 300 mg hs.  She's taking Xanax 0.5 mg AM and HS.  Getting a lot done.  Problematic boss got fired for performance problems.  No meds were changed at the June visit.  A little dissociation and derealization.  Worsened by insomnia. Staying up late bc not sleepy.  So took an additional Xanax HS to force herself to go to sleep.  Last few nights 6-7 hours and before it was 3-4 hours.  Off from work last 3 days helped.  Stopped Trileptal.  A little excessive spending.  Inderall helped anxiety a whole lot.  Xanax more calming than the others.  Last hypomania was November 2018 and then again April 2018.  Past Psychiatric Medication Trials: Poor response SSRIs,  Effexor, Paxil, sertraline itchy, Wellbutrin, Serzone, Strattera nausea,  Vraylar, lamotrigine, propranolol, Xanax, Latuda, olanzapine sedation, olanzapine, Trileptal 300 tired, Latuda fogginess, risperidone, Abilify, lithium, Klonopin, Ativan, Xanax, buspirone nausea NAC,   Review of Systems:  Review of Systems  Cardiovascular: Negative for palpitations.  Neurological: Negative for tremors and weakness.  Psychiatric/Behavioral: Positive for depression. Negative for agitation, behavioral problems, confusion, decreased concentration, dysphoric mood, hallucinations, self-injury, sleep disturbance and suicidal ideas. The patient is hyperactive. The patient is not nervous/anxious.     Medications: I have reviewed the patient's current medications.  Current Outpatient Medications  Medication Sig Dispense Refill  . ALPRAZolam (XANAX) 0.5 MG tablet TAKE 1 TABLET BY MOUTH EVERY 6  HOURS AS NEEDED 90 tablet 1  . lamoTRIgine (LAMICTAL) 150 MG tablet TAKE 1 TABLET BY MOUTH EVERY MORNING AND 2 TABLETS BY MOUTH EVERY EVENING 270 tablet 0  . levonorgestrel (MIRENA) 20 MCG/24HR IUD 1 each by Intrauterine route once.    . propranolol (INDERAL) 10 MG tablet TAKE 3  TABLETS (30 MG TOTAL) BY MOUTH 2 (TWO) TIMES DAILY. 100 tablet 0  . OXcarbazepine (TRILEPTAL) 150 MG tablet Take 1 tablet (150 mg total) by mouth 2 (two) times daily. (Patient not taking: Reported on 04/29/2019) 60 tablet 2   No current facility-administered medications for this visit.     Medication Side Effects: None  Allergies: No Known Allergies  Past Medical History:  Diagnosis Date  . Other and unspecified disc disorder of lumbar region    torn lumbar disc?    Family History  Problem Relation Age of Onset  . Diabetes Mother   . Diabetes Father   . Cancer Father        kidney  . Cancer Paternal Grandfather        PROSTATE    Social History   Socioeconomic History  . Marital status: Single    Spouse name: Not on file  . Number of children: Not on file  . Years of education: Not on file  . Highest education level: Not on file  Occupational History  . Not on file  Social Needs  . Financial resource strain: Not on file  . Food insecurity    Worry: Not on file    Inability: Not on file  . Transportation needs    Medical: Not on file    Non-medical: Not on file  Tobacco Use  . Smoking status: Current Every Day Smoker    Packs/day: 0.75  . Smokeless tobacco: Never Used  Substance and Sexual Activity  . Alcohol use: No  . Drug use: No  . Sexual activity: Not Currently    Birth control/protection: I.U.D.    Comment: 1st intercourse- 24, partners- 5   Lifestyle  . Physical activity    Days per week: Not on file    Minutes per session: Not on file  . Stress: Not on file  Relationships  . Social Musician on phone: Not on file    Gets together: Not on file    Attends religious service: Not on file    Active member of club or organization: Not on file    Attends meetings of clubs or organizations: Not on file    Relationship status: Not on file  . Intimate partner violence    Fear of current or ex partner: Not on file    Emotionally abused: Not  on file    Physically abused: Not on file    Forced sexual activity: Not on file  Other Topics Concern  . Not on file  Social History Narrative  . Not on file    Past Medical History, Surgical history, Social history, and Family history were reviewed and updated as appropriate.   Please see review of systems for further details on the patient's review from today.   Objective:   Physical Exam:  There were no vitals taken for this visit.  Physical Exam Neurological:     Mental Status: She is alert and oriented to person, place, and time.     Cranial Nerves: No dysarthria.  Psychiatric:        Attention and Perception: Attention normal.  Mood and Affect: Mood is anxious and elated.        Speech: Speech normal. Speech is not rapid and pressured, slurred or tangential.        Behavior: Behavior is not agitated or aggressive. Behavior is cooperative.        Thought Content: Thought content normal. Thought content is not paranoid or delusional. Thought content does not include homicidal or suicidal ideation. Thought content does not include homicidal or suicidal plan.        Cognition and Memory: Cognition and memory normal.        Judgment: Judgment normal.     Comments: She acknowledges hypomania.  Her speech is not pressured.  She has good insight into the hypomania usually.  She appeared a little hyper on the phone.     Lab Review:     Component Value Date/Time   NA 140 08/18/2016 0838   K 4.1 08/18/2016 0838   CL 106 08/18/2016 0838   CO2 27 08/18/2016 0838   GLUCOSE 93 08/18/2016 0838   BUN 8 08/18/2016 0838   CREATININE 0.68 08/18/2016 0838   CALCIUM 9.2 08/18/2016 0838   PROT 6.8 08/18/2016 0838   ALBUMIN 4.2 08/18/2016 0838   AST 17 08/18/2016 0838   ALT 29 08/18/2016 0838   ALKPHOS 65 08/18/2016 0838   BILITOT 0.3 08/18/2016 0838       Component Value Date/Time   WBC 7.3 08/18/2016 0838   RBC 4.57 08/18/2016 0838   HGB 14.2 08/18/2016 0838   HCT  41.0 08/18/2016 0838   PLT 278 08/18/2016 0838   MCV 89.7 08/18/2016 0838   MCH 31.1 08/18/2016 0838   MCHC 34.6 08/18/2016 0838   RDW 13.2 08/18/2016 0838   LYMPHSABS 2,044 08/18/2016 0838   MONOABS 292 08/18/2016 0838   EOSABS 511 (H) 08/18/2016 0838   BASOSABS 73 08/18/2016 0838    No results found for: POCLITH, LITHIUM   No results found for: PHENYTOIN, PHENOBARB, VALPROATE, CBMZ   .res Assessment: Plan:    Bipolar II disorder (HCC)  Generalized anxiety disorder  Panic disorder with agoraphobia  PTSD (post-traumatic stress disorder)  Insomnia due to mental condition  Hypomania (HCC)  Nicotine dependence, uncomplicated, unspecified nicotine product type  Medsensitive.  Lindsey [name] has a long history of bipolar disorder.  Usually manic episodes are mild.  Her mother also has bipolar disorder.  She has failed numerous mood stabilizers as noted above.  She is now hypomanic with no particular trigger.  This may be associated with a tendency toward spring hypomania and bipolar patients. Chronic compliance problems with addition of meds.  Rec restart Trileptal 150 mg HS. This is off label for bipolar mania or hypomania.  She is fairly sensitive to mood stabilizers historically.  She does not want weight gain.  That limits options given the failures noted above.  Discussed side effects in detail.  She agrees to this plan.  Encourage consistency and call us if there is some reason she cannot comply with the prescription.  The propranolol was very helpful for her anxiety.  It also controlled the palpitations.  We discussed the short-term risks associated with benzodiazepines including sedation and increased fall risk among others.  Discussed long-term side effect risk including dependence, potential withdrawal symptoms, and the potential eventual dose-related risk of dementia.  Xanax helped a lot with normal and social anxiety. Continue Xanax and propranolol prn.  Patient needs to sleep  in order to control the hypomania.  Supportive therapy  on managing the hypomania and resisting urges to spend excessively or risk-taking behaviors until the symptoms are under good control.  Be careful with caffeine bc anxiety.  If the increase in Xanax does not address the insomnia call back.  If there are side effects from the oxcarbazepine or it seems an inadequate to control hypomania call back.  She didn't do this last time.  Could consider 225 mg Trileptal bc CO sedation from 300 mg HS.  FU 2-3 mos.  Meredith Staggers, MD, DFAPA     Please see After Visit Summary for patient specific instructions.  Future Appointments  Date Time Provider Department Center  02/22/2020  3:30 PM Genia Del, MD [facility]    No orders of the defined types were placed in this encounter.     -------------------------------

## 2019-05-31 ENCOUNTER — Other Ambulatory Visit: Payer: Self-pay | Admitting: Psychiatry

## 2019-06-14 ENCOUNTER — Other Ambulatory Visit: Payer: Self-pay | Admitting: Physician Assistant

## 2019-07-28 ENCOUNTER — Other Ambulatory Visit: Payer: Self-pay | Admitting: Psychiatry

## 2019-08-15 ENCOUNTER — Other Ambulatory Visit: Payer: Self-pay | Admitting: Psychiatry

## 2019-08-16 ENCOUNTER — Other Ambulatory Visit: Payer: Self-pay | Admitting: Psychiatry

## 2019-10-14 ENCOUNTER — Other Ambulatory Visit: Payer: Self-pay

## 2019-10-14 DIAGNOSIS — F3181 Bipolar II disorder: Secondary | ICD-10-CM

## 2019-10-14 MED ORDER — OXCARBAZEPINE 150 MG PO TABS: 150.0000 mg | ORAL_TABLET | Freq: Two times a day (BID) | ORAL | 0 refills | Status: DC

## 2019-10-14 MED ORDER — PROPRANOLOL HCL 10 MG PO TABS: 30.0000 mg | ORAL_TABLET | Freq: Two times a day (BID) | ORAL | 0 refills | Status: DC

## 2019-10-18 ENCOUNTER — Telehealth: Payer: Self-pay | Admitting: Psychiatry

## 2019-10-18 ENCOUNTER — Other Ambulatory Visit: Payer: Self-pay

## 2019-10-18 MED ORDER — ALPRAZOLAM 0.5 MG PO TABS: 0.5000 mg | ORAL_TABLET | Freq: Three times a day (TID) | ORAL | 0 refills | Status: DC | PRN

## 2019-10-18 MED ORDER — LAMOTRIGINE 150 MG PO TABS: ORAL_TABLET | ORAL | 0 refills | Status: DC

## 2019-10-18 NOTE — Telephone Encounter (Signed)
Pt wants to transfer 2 RX's to [facility]. XANAX and LAMICTAL. Pt does not have futher information for [facility].

## 2019-10-18 NOTE — Telephone Encounter (Signed)
Last refill on Xanax 08/16/2019 #90 for 30 day supply

## 2019-10-18 NOTE — Telephone Encounter (Signed)
Received a refill request from [facility] today for both medications, will submit refills as requested.

## 2019-11-13 ENCOUNTER — Other Ambulatory Visit: Payer: Self-pay | Admitting: Psychiatry

## 2019-11-15 ENCOUNTER — Other Ambulatory Visit: Payer: Self-pay | Admitting: Psychiatry

## 2019-11-16 NOTE — Telephone Encounter (Signed)
Scheduled tomorrow

## 2019-11-17 ENCOUNTER — Encounter: Payer: Self-pay | Admitting: Psychiatry

## 2019-11-17 ENCOUNTER — Other Ambulatory Visit: Payer: Self-pay

## 2019-11-17 ENCOUNTER — Ambulatory Visit (INDEPENDENT_AMBULATORY_CARE_PROVIDER_SITE_OTHER): Payer: 59 | Admitting: Psychiatry

## 2019-11-17 DIAGNOSIS — F4310 Post-traumatic stress disorder, unspecified: Secondary | ICD-10-CM

## 2019-11-17 DIAGNOSIS — F411 Generalized anxiety disorder: Secondary | ICD-10-CM | POA: Diagnosis not present

## 2019-11-17 DIAGNOSIS — F3181 Bipolar II disorder: Secondary | ICD-10-CM

## 2019-11-17 DIAGNOSIS — F4001 Agoraphobia with panic disorder: Secondary | ICD-10-CM

## 2019-11-17 DIAGNOSIS — F5105 Insomnia due to other mental disorder: Secondary | ICD-10-CM

## 2019-11-17 MED ORDER — PROPRANOLOL HCL 10 MG PO TABS: 30.0000 mg | ORAL_TABLET | Freq: Two times a day (BID) | ORAL | 5 refills | Status: DC

## 2019-11-17 NOTE — Progress Notes (Signed)
KAM KUSHNIR [id number] 1976-01-19 44 y.o.   Subjective:   Patient ID:  Lindsey [name] is a 44 y.o. (DOB 06-29-1976) female.  Chief Complaint:  Chief Complaint  Patient presents with  . Follow-up    mood swings and anxiety    Depression        Associated symptoms include no decreased concentration and no suicidal ideas.   Lindsey [name] presents to today for follow-up of mood and anxiety.  When seen April 27th, 2020.  She was hypomanic at the time and having insomnia.  We increased Xanax 1 mg nightly and she was encouraged to increase oxcarbazepine to 600 mg nightly to help with the hypomania.  She is fairly sensitive to mood stabilizers and has failed several.  She returned in June reporting hypomania resolved.  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  Finally ended the trauma bond and is better.  Trileptal and Xanax at night is better but only taking 1/2 of 346m Trileptal bc sleepiness from 300 mg hs.  She's taking Xanax 0.5 mg AM and HS.  Getting a lot done.  Problematic boss got fired for performance problems.  No meds were changed at the June visit.  Last seen October 2020 the following was noted:She is now hypomanic with no particular trigger.  This may be associated with a tendency toward spring hypomania and bipolar patients. Chronic compliance problems with addition of meds.  Rec restart Trileptal 150 mg HS. This is off label for bipolar mania or hypomania.  She is fairly sensitive to mood stabilizers historically.  She does not want weight gain.   November 17, 2019 appointment, the following is noted: Been working on self growth and taking advantages of resources on internet and self esteem.  Pleased with meds. Not taken Trileptal.   Wonders if she has borderline pd bc splitting and black and white thinking.  Mood swings can be rapid from 30 min to 3 days.  Also triggered by abandonment. Chronic struggles with intrusive mother who she feels triggers  her. Panic triggered only with one person.  Social anxiety is less. Working from home helps. Patient reports stable mood and denies depressed or irritable moods.    Patient denies difficulty with sleep initiation or maintenance. Denies appetite disturbance.  Patient reports that energy and motivation have been good.  Patient denies any difficulty with concentration.  Patient denies any suicidal ideation.  Inderall helped anxiety a whole lot.  Xanax more calming than the others.  Lindsey [name] 44yo.  Her father is alcoholic.  Last hypomania was November 2018 and then again April 2018.  Past Psychiatric Medication Trials: Poor response SSRIs,  Effexor, Paxil, sertraline itchy, Wellbutrin, Serzone, Strattera nausea,  Vraylar, lamotrigine, propranolol, Xanax, Latuda, olanzapine sedation, olanzapine, Trileptal 300 tired, Latuda fogginess, risperidone, Abilify, lithium, Klonopin, Ativan, Xanax, buspirone nausea NAC,   Review of Systems:  Review of Systems  Cardiovascular: Negative for chest pain and palpitations.  Neurological: Negative for tremors and weakness.  Psychiatric/Behavioral: Positive for depression. Negative for agitation, behavioral problems, confusion, decreased concentration, dysphoric mood, hallucinations, self-injury, sleep disturbance and suicidal ideas. The patient is hyperactive. The patient is not nervous/anxious.     Medications: I have reviewed the patient's current medications.  Current Outpatient Medications  Medication Sig Dispense Refill  . ALPRAZolam (XANAX) 0.5 MG tablet Take 1 tablet (0.5 mg total) by mouth every 8 (eight) hours as needed. 90 tablet 0  . Cyanocobalamin (B-12 PO) Take by  mouth.    . lamoTRIgine (LAMICTAL) 150 MG tablet TAKE 1 TABLET BY MOUTH EVERY MORNING AND 2 TABLETS BY MOUTH EVERY EVENING 270 tablet 0  . levonorgestrel (MIRENA) 20 MCG/24HR IUD 1 each by Intrauterine route once.    . propranolol (INDERAL) 10 MG tablet Take 3 tablets (30 mg total)  by mouth 2 (two) times daily. 180 tablet 5  . OXcarbazepine (TRILEPTAL) 150 MG tablet Take 1 tablet (150 mg total) by mouth 2 (two) times daily. (Patient not taking: Reported on 11/17/2019) 180 tablet 0   No current facility-administered medications for this visit.    Medication Side Effects: None  Allergies: No Known Allergies  Past Medical History:  Diagnosis Date  . Other and unspecified disc disorder of lumbar region    torn lumbar disc?    Family History  Problem Relation Age of Onset  . Diabetes Mother   . Diabetes Father   . Cancer Father        kidney  . Cancer Paternal Grandfather        PROSTATE    Social History   Socioeconomic History  . Marital status: Single    Spouse name: Not on file  . Number of children: Not on file  . Years of education: Not on file  . Highest education level: Not on file  Occupational History  . Not on file  Tobacco Use  . Smoking status: Current Every Day Smoker    Packs/day: 0.75  . Smokeless tobacco: Never Used  Substance and Sexual Activity  . Alcohol use: No  . Drug use: No  . Sexual activity: Not Currently    Birth control/protection: I.U.D.    Comment: 1st intercourse- 24, partners- 5   Other Topics Concern  . Not on file  Social History Narrative  . Not on file   Social Determinants of Health   Financial Resource Strain:   . Difficulty of Paying Living Expenses:   Food Insecurity:   . Worried About Charity fundraiser in the Last Year:   . Arboriculturist in the Last Year:   Transportation Needs:   . Film/video editor (Medical):   Marland Kitchen Lack of Transportation (Non-Medical):   Physical Activity:   . Days of Exercise per Week:   . Minutes of Exercise per Session:   Stress:   . Feeling of Stress :   Social Connections:   . Frequency of Communication with Friends and Family:   . Frequency of Social Gatherings with Friends and Family:   . Attends Religious Services:   . Active Member of Clubs or  Organizations:   . Attends Archivist Meetings:   Marland Kitchen Marital Status:   Intimate Partner Violence:   . Fear of Current or Ex-Partner:   . Emotionally Abused:   Marland Kitchen Physically Abused:   . Sexually Abused:     Past Medical History, Surgical history, Social history, and Family history were reviewed and updated as appropriate.   Please see review of systems for further details on the patient's review from today.   Objective:   Physical Exam:  There were no vitals taken for this visit.  Physical Exam Constitutional:      General: She is not in acute distress. Musculoskeletal:        General: No deformity.  Neurological:     Mental Status: She is alert and oriented to person, place, and time.     Cranial Nerves: No dysarthria.  Coordination: Coordination normal.  Psychiatric:        Attention and Perception: Attention and perception normal. She does not perceive auditory or visual hallucinations.        Mood and Affect: Mood is anxious. Mood is not depressed or elated. Affect is not labile, blunt, angry or inappropriate.        Speech: Speech normal. Speech is not rapid and pressured, slurred or tangential.        Behavior: Behavior normal. Behavior is not agitated or aggressive. Behavior is cooperative.        Thought Content: Thought content normal. Thought content is not paranoid or delusional. Thought content does not include homicidal or suicidal ideation. Thought content does not include homicidal or suicidal plan.        Cognition and Memory: Cognition and memory normal.        Judgment: Judgment normal.     Comments: No longer hypomania     Lab Review:     Component Value Date/Time   NA 140 08/18/2016 0838   K 4.1 08/18/2016 0838   CL 106 08/18/2016 0838   CO2 27 08/18/2016 0838   GLUCOSE 93 08/18/2016 0838   BUN 8 08/18/2016 0838   CREATININE 0.68 08/18/2016 0838   CALCIUM 9.2 08/18/2016 0838   PROT 6.8 08/18/2016 0838   ALBUMIN 4.2 08/18/2016 0838    AST 17 08/18/2016 0838   ALT 29 08/18/2016 0838   ALKPHOS 65 08/18/2016 0838   BILITOT 0.3 08/18/2016 0838       Component Value Date/Time   WBC 7.3 08/18/2016 0838   RBC 4.57 08/18/2016 0838   HGB 14.2 08/18/2016 0838   HCT 41.0 08/18/2016 0838   PLT 278 08/18/2016 0838   MCV 89.7 08/18/2016 0838   MCH 31.1 08/18/2016 0838   MCHC 34.6 08/18/2016 0838   RDW 13.2 08/18/2016 0838   LYMPHSABS 2,044 08/18/2016 0838   MONOABS 292 08/18/2016 0838   EOSABS 511 (H) 08/18/2016 0838   BASOSABS 73 08/18/2016 0838    No results found for: POCLITH, LITHIUM   No results found for: PHENYTOIN, PHENOBARB, VALPROATE, CBMZ   .res Assessment: Plan:    Bipolar II disorder ([place])  Generalized anxiety disorder  Panic disorder with agoraphobia - Plan: propranolol (INDERAL) 10 MG tablet  PTSD (post-traumatic stress disorder) - Plan: propranolol (INDERAL) 10 MG tablet  Insomnia due to mental condition  Medsensitive.  Lindsey [name] has a long history of bipolar disorder.  Usually manic episodes are mild.  Her mother also has bipolar disorder.  She has failed numerous mood stabilizers as noted above.  She is now hypomanic with no particular trigger.  This may be associated with a tendency toward spring hypomania and bipolar patients. Chronic compliance problems with addition of meds.   Option Trileptal 150 mg HS. This is off label for bipolar mania or hypomania.  She is fairly sensitive to mood stabilizers historically.  She does not want weight gain.  That limits options given the failures noted above.  Discussed side effects in detail.  She agrees to this plan.   She feels self help has been beneficial sns she doesn't need more med.  Disc criteria for borderline PD.  Most effective treatment is DBT.  Recommend this as valuable even if full criteria is not met.  The propranolol was very helpful for her anxiety.  It also controlled the palpitations.  We discussed the short-term risks associated with  benzodiazepines including sedation and increased fall risk among  others.  Discussed long-term side effect risk including dependence, potential withdrawal symptoms, and the potential eventual dose-related risk of dementia.  Xanax helped a lot with normal and social anxiety. Continue Xanax and propranolol prn.  Patient needs to sleep in order to control the hypomania.  Supportive therapy on managing the hypomania and resisting urges to spend excessively or risk-taking behaviors until the symptoms are under good control.  Be careful with caffeine bc anxiety.  FU 4-6 mos.  Lindsey Parents, MD, DFAPA     Please see After Visit Summary for patient specific instructions.  Future Appointments  Date Time Provider [place]  02/22/2020  3:30 PM Princess Bruins, MD [facility]    No orders of the defined types were placed in this encounter.     -------------------------------

## 2019-12-23 ENCOUNTER — Other Ambulatory Visit: Payer: Self-pay | Admitting: Family Medicine

## 2019-12-23 ENCOUNTER — Ambulatory Visit
Admission: RE | Admit: 2019-12-23 | Discharge: 2019-12-23 | Disposition: A | Discharge status: Home / Self Care | Payer: 59 | Source: Ambulatory Visit | Attending: Family Medicine | Admitting: Family Medicine

## 2019-12-23 DIAGNOSIS — M79674 Pain in right toe(s): Secondary | ICD-10-CM

## 2020-01-11 ENCOUNTER — Other Ambulatory Visit: Payer: Self-pay | Admitting: Psychiatry

## 2020-01-17 ENCOUNTER — Telehealth: Payer: Self-pay | Admitting: Psychiatry

## 2020-01-17 NOTE — Telephone Encounter (Signed)
I know her well.  I would agree to a work not to write her out for 3 weeks.

## 2020-01-17 NOTE — Telephone Encounter (Signed)
Patient called and said that she would like to have Dr. Jennelle Human write a letter so she can be out of work for 2-4 weeks. She has an appointment in 8/26. She doesn't know if she needs top be seen before you do the letter. Please give her a call at [phone]

## 2020-01-18 NOTE — Telephone Encounter (Signed)
Letter dictated.  Chg $20

## 2020-01-25 ENCOUNTER — Other Ambulatory Visit: Payer: Self-pay | Admitting: Psychiatry

## 2020-01-25 NOTE — Telephone Encounter (Signed)
Next apt with [facility] is 03/15/2020, last refill for 90 day 10/19/2019

## 2020-02-22 ENCOUNTER — Encounter: Payer: 59 | Admitting: Obstetrics & Gynecology

## 2020-02-22 DIAGNOSIS — Z0289 Encounter for other administrative examinations: Secondary | ICD-10-CM

## 2020-03-15 ENCOUNTER — Ambulatory Visit (INDEPENDENT_AMBULATORY_CARE_PROVIDER_SITE_OTHER): Payer: 59 | Admitting: Psychiatry

## 2020-03-15 ENCOUNTER — Encounter: Payer: Self-pay | Admitting: Psychiatry

## 2020-03-15 ENCOUNTER — Other Ambulatory Visit: Payer: Self-pay

## 2020-03-15 DIAGNOSIS — F411 Generalized anxiety disorder: Secondary | ICD-10-CM

## 2020-03-15 DIAGNOSIS — F5105 Insomnia due to other mental disorder: Secondary | ICD-10-CM

## 2020-03-15 DIAGNOSIS — F4001 Agoraphobia with panic disorder: Secondary | ICD-10-CM

## 2020-03-15 DIAGNOSIS — F3181 Bipolar II disorder: Secondary | ICD-10-CM | POA: Diagnosis not present

## 2020-03-15 DIAGNOSIS — F4310 Post-traumatic stress disorder, unspecified: Secondary | ICD-10-CM

## 2020-03-15 MED ORDER — LAMOTRIGINE 150 MG PO TABS: ORAL_TABLET | ORAL | 1 refills | Status: DC

## 2020-03-15 NOTE — Progress Notes (Signed)
Lindsey [name] [id number] 1976/05/22 44 y.o.   Subjective:   Patient ID:  Lindsey [name] is a 44 y.o. (DOB [birth year]) female.  Chief Complaint:  Chief Complaint  Patient presents with  . Follow-up    Medication Management  . Other    Bipolar  . Stress    Depression        Associated symptoms include no decreased concentration and no suicidal ideas.   Lindsey [name] presents to today for follow-up of mood and anxiety.  When seen April 27th, 2020.  She was hypomanic at the time and having insomnia.  We increased Xanax 1 mg nightly and she was encouraged to increase oxcarbazepine to 600 mg nightly to help with the hypomania.  She is fairly sensitive to mood stabilizers and has failed several.  She returned in June reporting hypomania resolved.  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  Finally ended the trauma bond and is better.  Trileptal and Xanax at night is better but only taking 1/2 of 369m Trileptal bc sleepiness from 300 mg hs.  She's taking Xanax 0.5 mg AM and HS.  Getting a lot done.  Problematic boss got fired for performance problems.  No meds were changed at the June visit.  Last seen October 2020 the following was noted:She is now hypomanic with no particular trigger.  This may be associated with a tendency toward spring hypomania and bipolar patients. Chronic compliance problems with addition of meds.  Rec restart Trileptal 150 mg HS. This is off label for bipolar mania or hypomania.  She is fairly sensitive to mood stabilizers historically.  She does not want weight gain.   November 17, 2019 appointment, the following is noted: Been working on self growth and taking advantages of resources on internet and self esteem.  Pleased with meds. Not taken Trileptal.   Wonders if she has borderline pd bc splitting and black and white thinking.  Mood swings can be rapid from 30 min to 3 days.  Also triggered by abandonment. Chronic struggles with intrusive  mother who she feels triggers her. Panic triggered only with one person.  Social anxiety is less. Working from home helps. Patient reports stable mood and denies depressed or irritable moods.    Patient denies difficulty with sleep initiation or maintenance. Denies appetite disturbance.  Patient reports that energy and motivation have been good.  Patient denies any difficulty with concentration.  Patient denies any suicidal ideation. Plan no med changes  03/15/20 appt. With the following noted:   Wrote her out of work for 3 weeks DT multiple stressors with "mental health overload" including father of child facing jail then mistake at work.  Intense sensitivity to failure.  Couldn't eat and lost 13# in 3 weeks.  Needed break from work to manage stress.  Break was very helpful at restoration. Now doing OK.   Working from home has been good for her.  May ask for excuse to work part of the time from home. Started B12 and D3 which helped energy.  Not overly depressed.  No mood swings.  Incredible fear of abandonment also flared up in rel with current BF. Patient reports stable mood and denies depressed or irritable moods.   Patient denies difficulty with sleep initiation or maintenance.  NM if naps.  Denies appetite disturbance.  Patient reports that energy and motivation have been good.  Patient denies any difficulty with concentration.  Patient denies any suicidal ideation.  Inderall  helped anxiety a whole lot.  Xanax more calming than the others.  Lindsey [name] is 35 yo.  Her father is alcoholic.  Last hypomania was November 2018 and then again April 2018.  Past Psychiatric Medication Trials: Poor response SSRIs,  Effexor, Paxil, sertraline itchy, Wellbutrin, Serzone, Strattera nausea,  Vraylar, lamotrigine, propranolol, Xanax, Latuda, olanzapine sedation, olanzapine, Trileptal 300 tired, Latuda fogginess, risperidone, Abilify, lithium, Klonopin, Ativan, Xanax, buspirone nausea NAC,   Review of  Systems:  Review of Systems  Cardiovascular: Negative for chest pain and palpitations.  Gastrointestinal: Negative for abdominal distention.  Neurological: Negative for tremors and weakness.  Psychiatric/Behavioral: Positive for depression. Negative for agitation, behavioral problems, confusion, decreased concentration, dysphoric mood, hallucinations, self-injury, sleep disturbance and suicidal ideas. The patient is hyperactive. The patient is not nervous/anxious.     Medications: I have reviewed the patient's current medications.  Current Outpatient Medications  Medication Sig Dispense Refill  . ALPRAZolam (XANAX) 0.5 MG tablet Take 1 tablet by mouth every 8 hours as needed. 90 tablet 0  . Cholecalciferol (VITAMIN D3) 25 MCG (1000 UT) CAPS Take by mouth.    . Cyanocobalamin (B-12 PO) Take by mouth.    . levonorgestrel (MIRENA) 20 MCG/24HR IUD 1 each by Intrauterine route once.    . propranolol (INDERAL) 10 MG tablet Take 3 tablets (30 mg total) by mouth 2 (two) times daily. 180 tablet 5  . lamoTRIgine (LAMICTAL) 150 MG tablet Take 1 tablet by mouth every morning and take 2 tablets by mouth every evening. 270 tablet 1   No current facility-administered medications for this visit.    Medication Side Effects: None  Allergies: No Known Allergies  Past Medical History:  Diagnosis Date  . Other and unspecified disc disorder of lumbar region    torn lumbar disc?    Family History  Problem Relation Age of Onset  . Diabetes Mother   . Diabetes Father   . Cancer Father        kidney  . Cancer Paternal Grandfather        PROSTATE    Social History   Socioeconomic History  . Marital status: Single    Spouse name: Not on file  . Number of children: Not on file  . Years of education: Not on file  . Highest education level: Not on file  Occupational History  . Not on file  Tobacco Use  . Smoking status: Current Every Day Smoker    Packs/day: 0.75  . Smokeless tobacco: Never  Used  Vaping Use  . Vaping Use: Never used  Substance and Sexual Activity  . Alcohol use: No  . Drug use: No  . Sexual activity: Not Currently    Birth control/protection: I.U.D.    Comment: 1st intercourse- 24, partners- 5   Other Topics Concern  . Not on file  Social History Narrative  . Not on file   Social Determinants of Health   Financial Resource Strain:   . Difficulty of Paying Living Expenses: Not on file  Food Insecurity:   . Worried About Charity fundraiser in the Last Year: Not on file  . Ran Out of Food in the Last Year: Not on file  Transportation Needs:   . Lack of Transportation (Medical): Not on file  . Lack of Transportation (Non-Medical): Not on file  Physical Activity:   . Days of Exercise per Week: Not on file  . Minutes of Exercise per Session: Not on file  Stress:   . Feeling  of Stress : Not on file  Social Connections:   . Frequency of Communication with Friends and Family: Not on file  . Frequency of Social Gatherings with Friends and Family: Not on file  . Attends Religious Services: Not on file  . Active Member of Clubs or Organizations: Not on file  . Attends Archivist Meetings: Not on file  . Marital Status: Not on file  Intimate Partner Violence:   . Fear of Current or Ex-Partner: Not on file  . Emotionally Abused: Not on file  . Physically Abused: Not on file  . Sexually Abused: Not on file    Past Medical History, Surgical history, Social history, and Family history were reviewed and updated as appropriate.   Please see review of systems for further details on the patient's review from today.   Objective:   Physical Exam:  There were no vitals taken for this visit.  Physical Exam Constitutional:      General: She is not in acute distress. Musculoskeletal:        General: No deformity.  Neurological:     Mental Status: She is alert and oriented to person, place, and time.     Cranial Nerves: No dysarthria.      Coordination: Coordination normal.  Psychiatric:        Attention and Perception: Attention and perception normal. She does not perceive auditory or visual hallucinations.        Mood and Affect: Mood is anxious. Mood is not depressed or elated. Affect is not labile, blunt, angry or inappropriate.        Speech: Speech normal. Speech is not rapid and pressured, slurred or tangential.        Behavior: Behavior normal. Behavior is not agitated or aggressive. Behavior is cooperative.        Thought Content: Thought content normal. Thought content is not paranoid or delusional. Thought content does not include homicidal or suicidal ideation. Thought content does not include homicidal or suicidal plan.        Cognition and Memory: Cognition and memory normal.        Judgment: Judgment normal.     Comments: No  hypomania     Lab Review:     Component Value Date/Time   NA 140 08/18/2016 0838   K 4.1 08/18/2016 0838   CL 106 08/18/2016 0838   CO2 27 08/18/2016 0838   GLUCOSE 93 08/18/2016 0838   BUN 8 08/18/2016 0838   CREATININE 0.68 08/18/2016 0838   CALCIUM 9.2 08/18/2016 0838   PROT 6.8 08/18/2016 0838   ALBUMIN 4.2 08/18/2016 0838   AST 17 08/18/2016 0838   ALT 29 08/18/2016 0838   ALKPHOS 65 08/18/2016 0838   BILITOT 0.3 08/18/2016 0838       Component Value Date/Time   WBC 7.3 08/18/2016 0838   RBC 4.57 08/18/2016 0838   HGB 14.2 08/18/2016 0838   HCT 41.0 08/18/2016 0838   PLT 278 08/18/2016 0838   MCV 89.7 08/18/2016 0838   MCH 31.1 08/18/2016 0838   MCHC 34.6 08/18/2016 0838   RDW 13.2 08/18/2016 0838   LYMPHSABS 2,044 08/18/2016 0838   MONOABS 292 08/18/2016 0838   EOSABS 511 (H) 08/18/2016 0838   BASOSABS 73 08/18/2016 0838    No results found for: POCLITH, LITHIUM   No results found for: PHENYTOIN, PHENOBARB, VALPROATE, CBMZ   .res Assessment: Plan:    Bipolar II disorder ([place]) - Plan: lamoTRIgine (LAMICTAL) 150 MG tablet  Generalized anxiety  disorder  Panic disorder with agoraphobia  PTSD (post-traumatic stress disorder)  Insomnia due to mental condition  Medsensitive.  Barabara has a long history of bipolar disorder.  Usually manic episodes are mild.  Her mother also has bipolar disorder.  She has failed numerous mood stabilizers as noted above.  She is now hypomanic with no particular trigger.  This may be associated with a tendency toward spring hypomania and bipolar patients. Chronic compliance problems with addition of meds.   Option Trileptal 150 mg HS. This is off label for bipolar mania or hypomania.  She is fairly sensitive to mood stabilizers historically.  She does not want weight gain.  That limits options given the failures noted above.  Discussed side effects in detail.  She agrees to this plan.   She feels self help has been beneficial sns she doesn't need more med.  Disc criteria for borderline PD.  Most effective treatment is DBT.  Recommend this as valuable even if full criteria is not met.  She didn't try it.Disc this in depth even without dx of borderline PD.  Disc other types of therapies she might pursue.   The propranolol was very helpful for her anxiety.  It also controlled the palpitations. Try it before naps to see if it prevents NM. Consider doxazosin.  We discussed the short-term risks associated with benzodiazepines including sedation and increased fall risk among others.  Discussed long-term side effect risk including dependence, potential withdrawal symptoms, and the potential eventual dose-related risk of dementia.  Xanax helped a lot with normal and social anxiety. Continue Xanax and propranolol prn.  Patient needs to sleep in order to control the hypomania.  Supportive therapy on managing the recent stressors and disc recent LOA from work for mental health reasons.  Was helpful.  Disc possible return to in office from work at home issues.  Be careful with caffeine bc anxiety.  FU 4-6  mos.  Lynder Parents, MD, DFAPA     Please see After Visit Summary for patient specific instructions.  Future Appointments  Date Time Provider [place]  05/11/2020  9:30 AM Princess Bruins, MD [facility]    No orders of the defined types were placed in this encounter.     -------------------------------

## 2020-04-06 ENCOUNTER — Ambulatory Visit (INDEPENDENT_AMBULATORY_CARE_PROVIDER_SITE_OTHER): Payer: 59 | Admitting: Obstetrics & Gynecology

## 2020-04-06 ENCOUNTER — Encounter: Payer: Self-pay | Admitting: Obstetrics & Gynecology

## 2020-04-06 ENCOUNTER — Other Ambulatory Visit: Payer: Self-pay

## 2020-04-06 VITALS — BP 130/80

## 2020-04-06 DIAGNOSIS — R102 Pelvic and perineal pain: Secondary | ICD-10-CM

## 2020-04-06 DIAGNOSIS — Z30431 Encounter for routine checking of intrauterine contraceptive device: Secondary | ICD-10-CM

## 2020-04-06 NOTE — Progress Notes (Signed)
    TUCKER STEEDLEY Sep 19, 1975 [id number]        44 y.o.  G2P1011 Stable relationship  RP: Pelvic pain/discomfort x 6 days  HPI: Midline pelvic discomfort x 6 days.  The pain radiates to the lower back and both thighs. No neuro Sx in the legs or feet. No abnormal vaginal discharge.  Last IC 3 wks ago.  Mirena IUD x 03/2017.  Very light menses monthly, LMP 2 weeks ago.  Urine/BMs wnl.  No fever.   OB History  Gravida Para Term Preterm AB Living  2 1 1   1 1   SAB TAB Ectopic Multiple Live Births          1    # Outcome Date GA Lbr Len/2nd Weight Sex Delivery Anes PTL Lv  2 AB           1 Term     F CS-Unspec  N LIV    Past medical history,surgical history, problem list, medications, allergies, family history and social history were all reviewed and documented in the EPIC chart.   Directed ROS with pertinent positives and negatives documented in the history of present illness/assessment and plan.  Exam:  Vitals:   04/06/20 1022  BP: 130/80   General appearance:  Normal  Abdomen: Normal, soft, not distended.  Gynecologic exam: Vulva normal.  Speculum:  Cervic normal.  IUD strings visible at EO.  Normal vaginal secretions.  Gono-Chlam done.  Bimanual exam:  Cervix NT to mobilization.  Uterus AV, mobile, NT, normal size.  No adnexal mass, NT.  U/A: Yellow clear, protein negative, nitrites negative, white blood cells 6-10, red blood cells negative, bacteria few.  Urine culture pending.   Assessment/Plan:  44 y.o. G2P1011   1. Pelvic pain in female Pelvic discomfort for 6 days improved today.  Corresponds to midcycle, possible ovulation time.  Urine analysis with no significant abnormality, will wait on culture results to decide if treatment is needed.  Rule out STD with gonorrhea and Chlamydia done today.  Follow-up with pelvic ultrasound to further investigate. - Urinalysis,Complete w/RFL Culture - 45 Transvaginal Non-OB; Future - C. trachomatis/N. gonorrhoeae RNA  2.  Encounter for routine checking of intrauterine contraceptive device (IUD) Mirena IUD in good position.  Patient reassured.  [place] MD, 10:54 AM 04/06/2020

## 2020-04-08 LAB — URINALYSIS, COMPLETE W/RFL CULTURE
Bilirubin Urine: NEGATIVE
Glucose, UA: NEGATIVE
Hgb urine dipstick: NEGATIVE
Hyaline Cast: NONE SEEN /LPF
Ketones, ur: NEGATIVE
Nitrites, Initial: NEGATIVE
Protein, ur: NEGATIVE
RBC / HPF: NONE SEEN /HPF (ref 0–2)
Specific Gravity, Urine: 1.025 (ref 1.001–1.03)
pH: 5.5 (ref 5.0–8.0)

## 2020-04-08 LAB — URINE CULTURE
MICRO NUMBER:: 10964343
SPECIMEN QUALITY:: ADEQUATE

## 2020-04-08 LAB — CULTURE INDICATED

## 2020-04-08 LAB — C. TRACHOMATIS/N. GONORRHOEAE RNA
C. trachomatis RNA, TMA: NOT DETECTED
N. gonorrhoeae RNA, TMA: NOT DETECTED

## 2020-04-26 ENCOUNTER — Ambulatory Visit: Payer: 59 | Admitting: Obstetrics & Gynecology

## 2020-04-26 ENCOUNTER — Other Ambulatory Visit: Payer: 59

## 2020-05-08 ENCOUNTER — Ambulatory Visit: Payer: 59 | Admitting: Obstetrics & Gynecology

## 2020-05-08 ENCOUNTER — Other Ambulatory Visit: Payer: 59

## 2020-05-08 DIAGNOSIS — Z0289 Encounter for other administrative examinations: Secondary | ICD-10-CM

## 2020-05-11 ENCOUNTER — Encounter: Payer: 59 | Admitting: Obstetrics & Gynecology

## 2020-05-24 ENCOUNTER — Ambulatory Visit: Payer: 59 | Admitting: Obstetrics & Gynecology

## 2020-05-24 ENCOUNTER — Other Ambulatory Visit: Payer: 59

## 2020-05-30 ENCOUNTER — Telehealth: Payer: Self-pay | Admitting: Psychiatry

## 2020-05-30 ENCOUNTER — Other Ambulatory Visit: Payer: Self-pay | Admitting: Psychiatry

## 2020-05-30 MED ORDER — ALPRAZOLAM 0.5 MG PO TABS: 0.5000 mg | ORAL_TABLET | Freq: Three times a day (TID) | ORAL | 1 refills | Status: DC | PRN

## 2020-05-30 NOTE — Telephone Encounter (Signed)
Patient called for a refill on Alprazolam. Please call to [facility] in [place], [place]. Next appt on 11/22 with Dr. Jennelle Human.

## 2020-06-11 ENCOUNTER — Other Ambulatory Visit: Payer: Self-pay

## 2020-06-11 ENCOUNTER — Ambulatory Visit (INDEPENDENT_AMBULATORY_CARE_PROVIDER_SITE_OTHER): Payer: 59 | Admitting: Obstetrics & Gynecology

## 2020-06-11 ENCOUNTER — Ambulatory Visit (INDEPENDENT_AMBULATORY_CARE_PROVIDER_SITE_OTHER): Payer: 59

## 2020-06-11 ENCOUNTER — Ambulatory Visit: Payer: 59 | Admitting: Psychiatry

## 2020-06-11 ENCOUNTER — Encounter: Payer: Self-pay | Admitting: Obstetrics & Gynecology

## 2020-06-11 DIAGNOSIS — N8311 Corpus luteum cyst of right ovary: Secondary | ICD-10-CM

## 2020-06-11 DIAGNOSIS — R102 Pelvic and perineal pain: Secondary | ICD-10-CM

## 2020-06-11 DIAGNOSIS — N854 Malposition of uterus: Secondary | ICD-10-CM

## 2020-06-11 DIAGNOSIS — Z30431 Encounter for routine checking of intrauterine contraceptive device: Secondary | ICD-10-CM

## 2020-06-11 NOTE — Progress Notes (Signed)
    Lindsey [name] Nov 24, 1975 [id number]        44 y.o.  G2P1011   RP: Pelvic pain for Pelvic US  HPI: Pelvic pain resolved.  Well on Mirena IUD.  STI screen neg 03/2020.   OB History  Gravida Para Term Preterm AB Living  2 1 1   1 1   SAB TAB Ectopic Multiple Live Births          1    # Outcome Date GA Lbr Len/2nd Weight Sex Delivery Anes PTL Lv  2 AB           1 Term     F CS-Unspec  N LIV    Past medical history,surgical history, problem list, medications, allergies, family history and social history were all reviewed and documented in the EPIC chart.   Directed ROS with pertinent positives and negatives documented in the history of present illness/assessment and plan.  Exam:  There were no vitals filed for this visit. General appearance:  Normal  Pelvic today: T/V images.  Anteverted uterus normal in size and shape with no myometrial mass.  The uterus is measured at 8.63 x 5.47 x 4.19 cm.  The endometrial lining is thin and symmetrical with the IUD in good intra uterine position.  The uterus is slanted from a previous C-section scar.  The endometrial lining is measured at 5.15 mm with no mass or thickening seen.  Both ovaries are normal in size. A right ovarian avascular collapsed corpus luteum is measured at 1.2 cm.  The left ovary presents a small follicle measured at 2.1 cm and avascular.  No adnexal mass.  No free fluid in the posterior cul-de-sac.   Assessment/Plan:  44 y.o. G2P1011   1. Pelvic pain in female Resolved pelvic pain, probably ovulatory pain.  STI screen - September 2021.  Pelvic ultrasound findings reviewed with patient, uterus and ovaries normal with a small follicle and a corpus luteum cyst.  Patient reassured.  2. Encounter for routine checking of intrauterine contraceptive device (IUD) IUD in good intra uterine position confirmed by ultrasound today.  October 2021 MD, 4:02 PM 06/11/2020

## 2020-07-27 ENCOUNTER — Encounter: Payer: 59 | Admitting: Obstetrics & Gynecology

## 2020-08-02 ENCOUNTER — Ambulatory Visit: Payer: 59 | Admitting: Psychiatry

## 2020-09-14 IMAGING — DX DG FOOT COMPLETE 3+V*R*
3 series · 3 of 3 positions shown · non-contrast
Comparison: None.

CLINICAL DATA: Right great toe pain after injury.

EXAM:
RIGHT FOOT COMPLETE - 3+ VIEW

[dg foot complete right (1 of 3)]
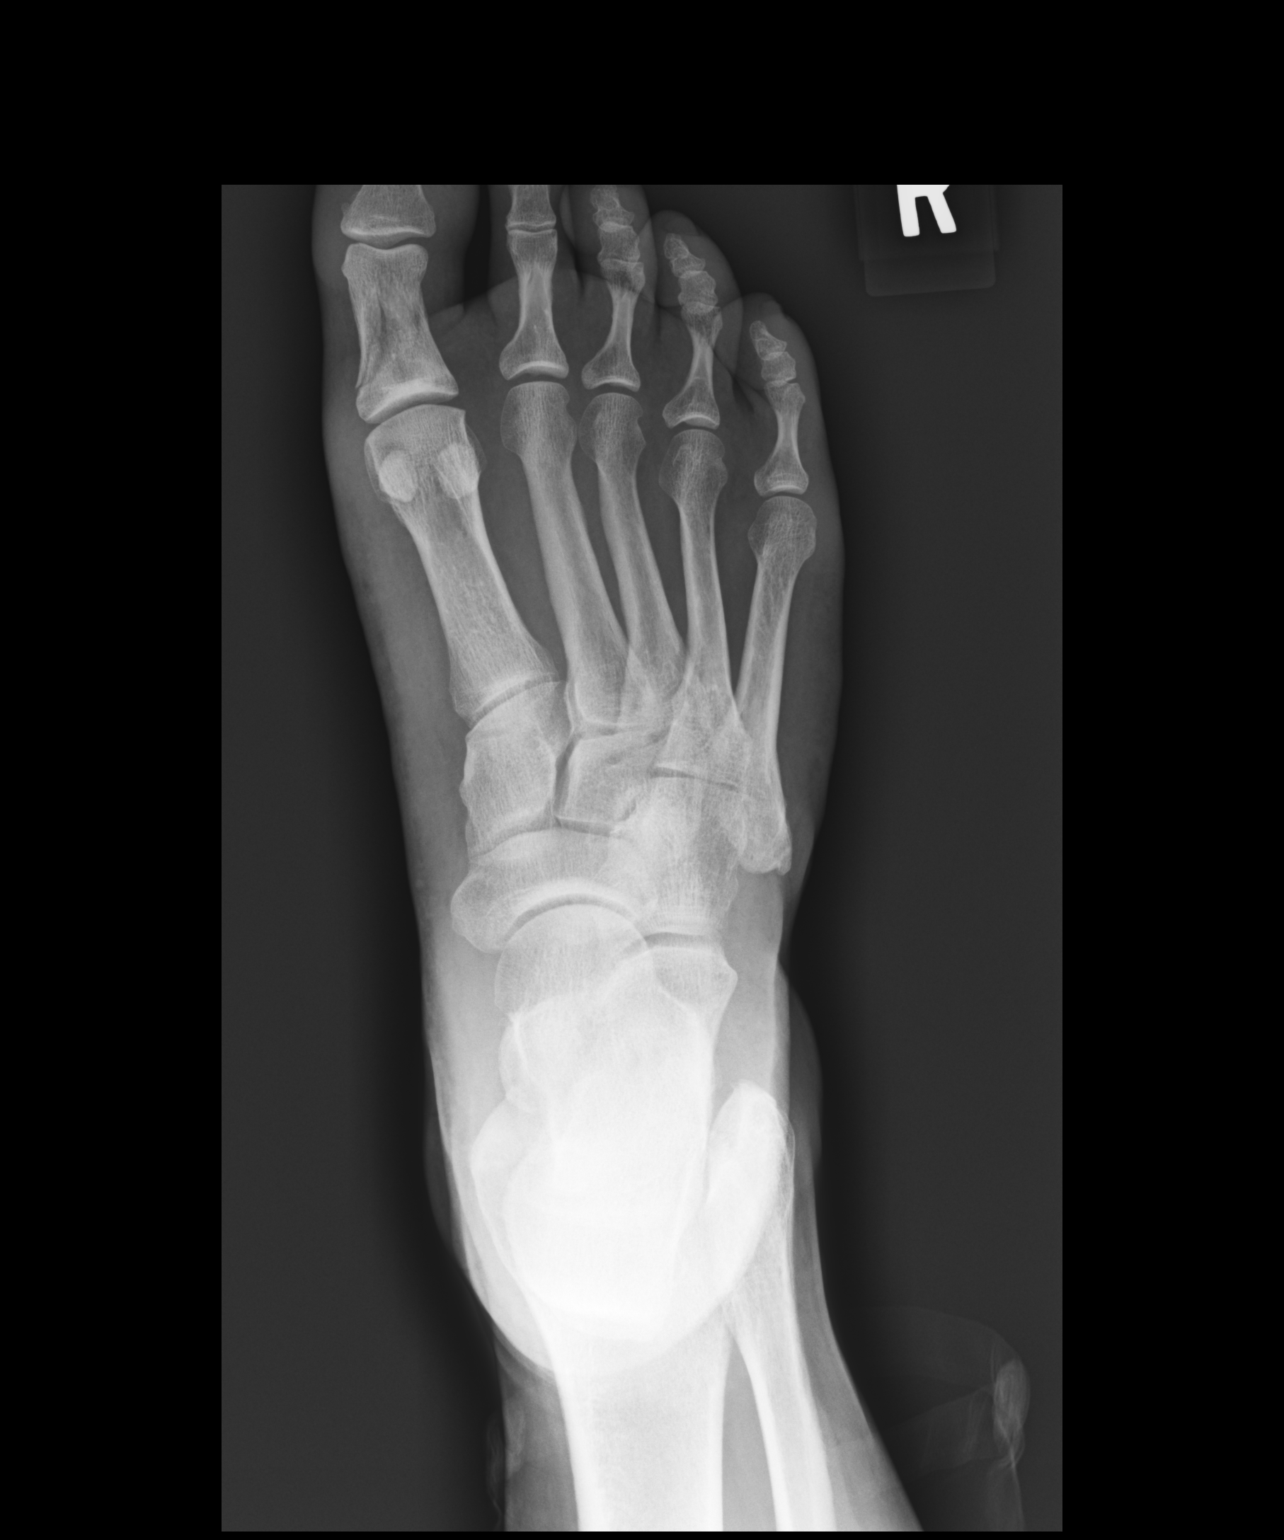

[dg foot complete right (2 of 3)]
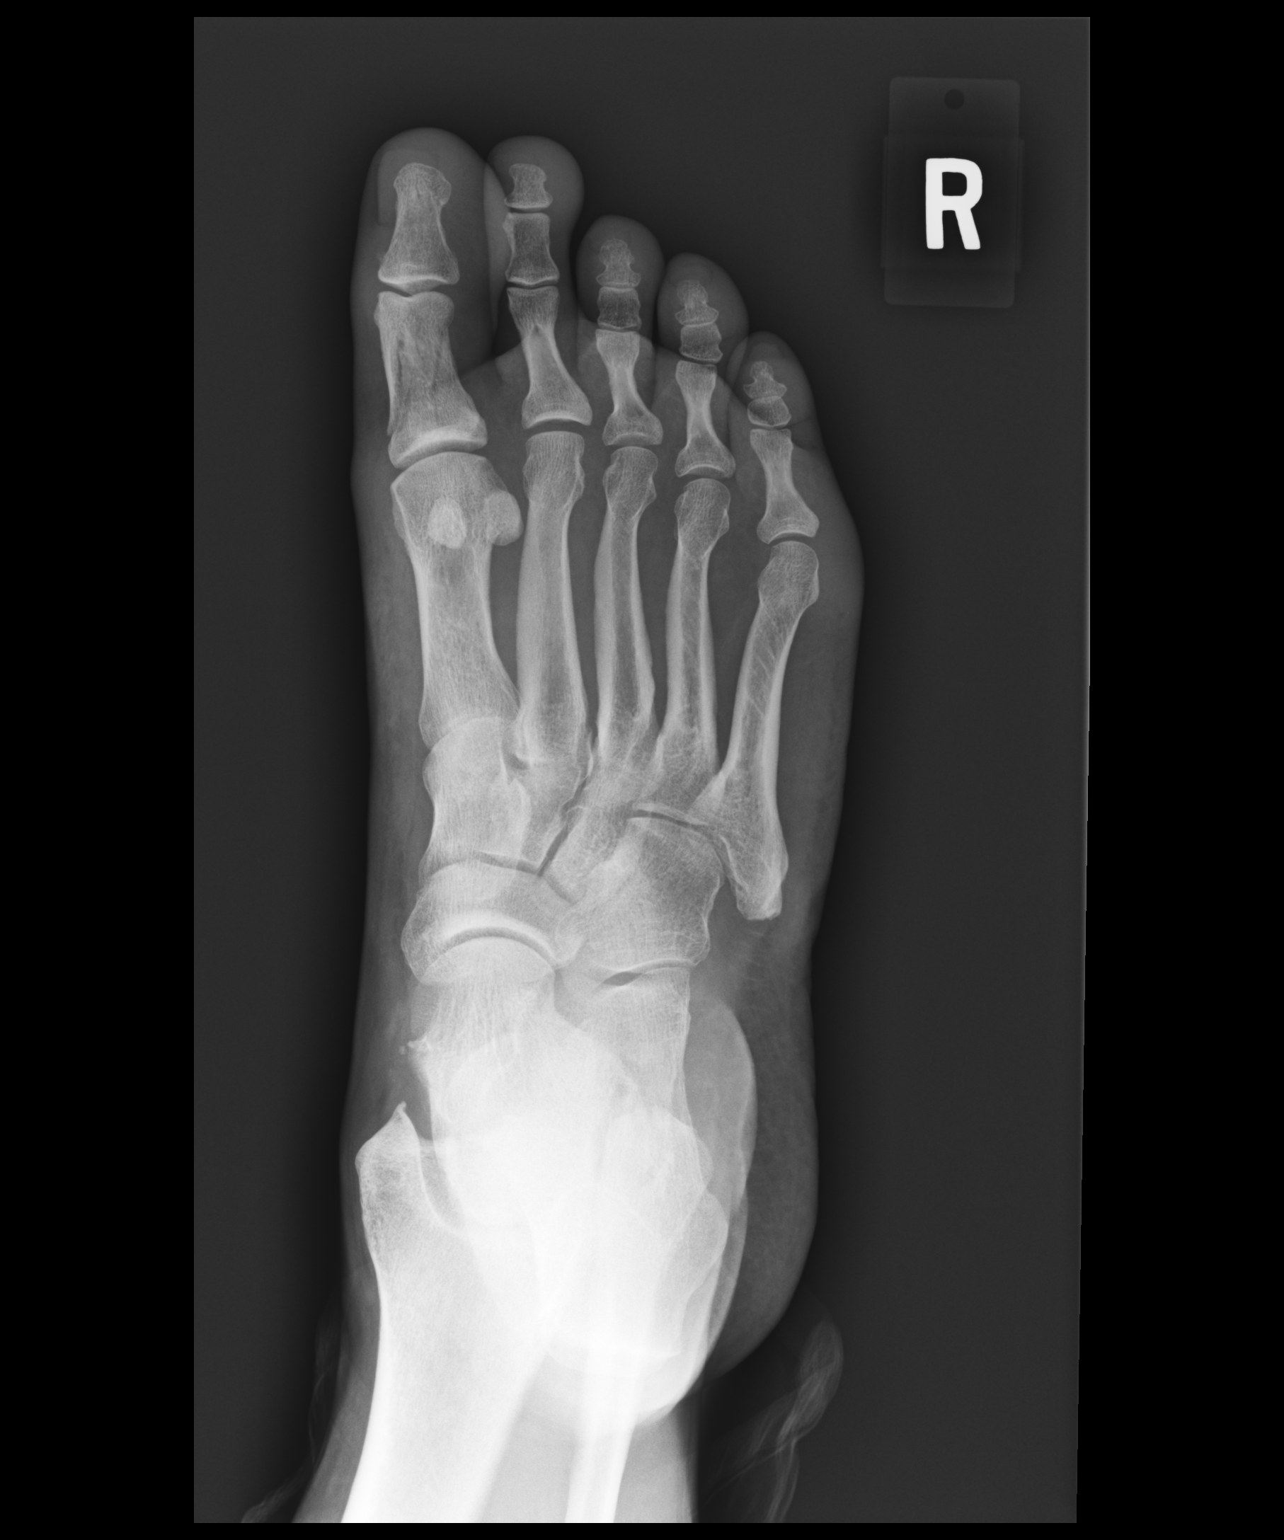

[dg foot complete right (3 of 3)]
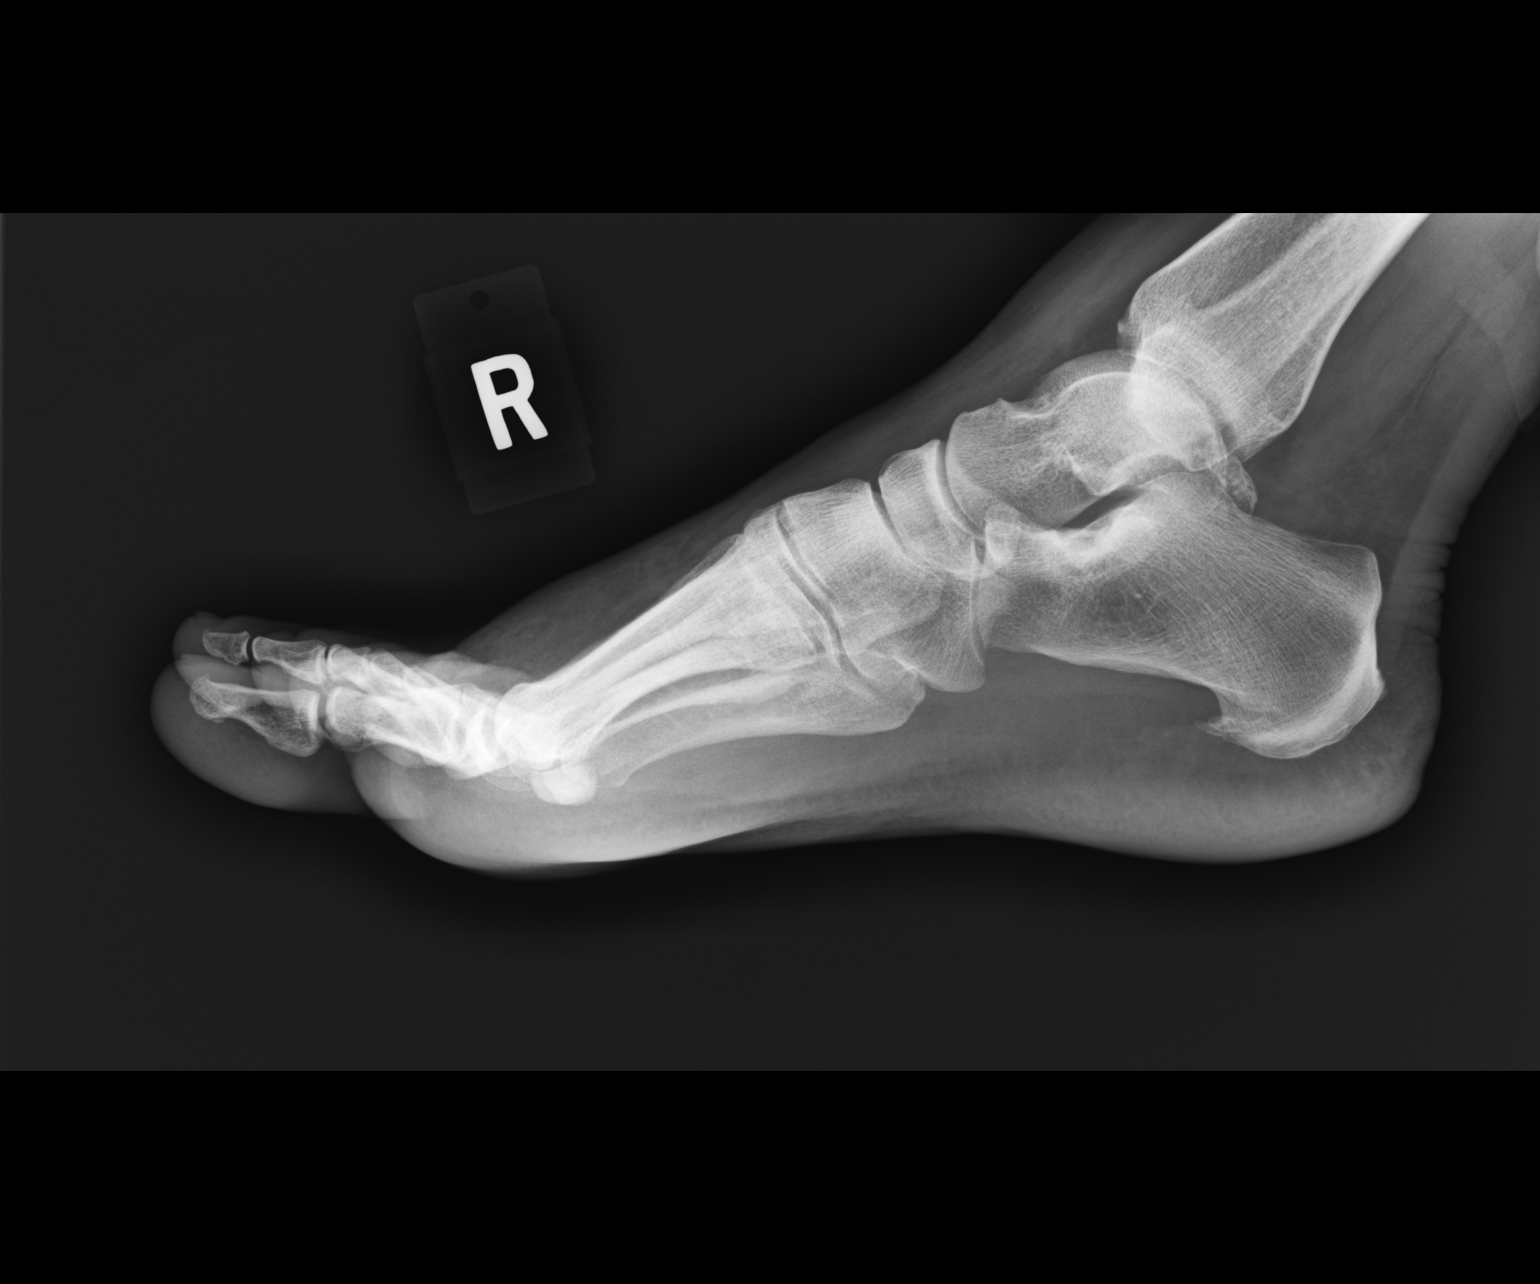

[3 of 3 positions shown; findings below may reference images not displayed]

FINDINGS: Minimally displaced fracture is seen involving the first proximal
phalanx. Mild posterior calcaneal spurring is noted. Joint spaces
are unremarkable. No soft tissue abnormality is noted.
IMPRESSION: Minimally displaced first proximal phalangeal fracture.

## 2020-09-19 ENCOUNTER — Ambulatory Visit: Payer: 59 | Admitting: Psychiatry

## 2020-10-22 ENCOUNTER — Other Ambulatory Visit: Payer: Self-pay | Admitting: Psychiatry

## 2020-10-22 ENCOUNTER — Telehealth: Payer: Self-pay | Admitting: Psychiatry

## 2020-10-22 MED ORDER — ALPRAZOLAM 0.5 MG PO TABS: 0.5000 mg | ORAL_TABLET | Freq: Three times a day (TID) | ORAL | 1 refills | Status: DC | PRN

## 2020-10-22 NOTE — Telephone Encounter (Signed)
Faryn called in requesting refill for Xanax 0.5mg . She has appt 5/2. Pharmacy [facility] [address] [place]. She also states that this is the direct number to pharmacy rep if needed. [phone]

## 2020-10-22 NOTE — Telephone Encounter (Signed)
Please review

## 2020-10-24 NOTE — Telephone Encounter (Signed)
Please also RF Propranolol and Lamictal for her. Use [employer]. Aptt 5/2

## 2020-10-24 NOTE — Telephone Encounter (Signed)
I can send the other 2 medications if you approve.Looks like propranolol may still have refills

## 2020-10-25 ENCOUNTER — Other Ambulatory Visit: Payer: Self-pay | Admitting: Psychiatry

## 2020-10-25 DIAGNOSIS — F3181 Bipolar II disorder: Secondary | ICD-10-CM

## 2020-10-25 MED ORDER — LAMOTRIGINE 150 MG PO TABS: ORAL_TABLET | ORAL | 1 refills | Status: DC

## 2020-10-25 NOTE — Telephone Encounter (Signed)
Sent all

## 2020-10-30 ENCOUNTER — Other Ambulatory Visit: Payer: Self-pay

## 2020-10-30 ENCOUNTER — Other Ambulatory Visit: Payer: Self-pay | Admitting: Psychiatry

## 2020-10-30 ENCOUNTER — Telehealth: Payer: Self-pay | Admitting: Psychiatry

## 2020-10-30 DIAGNOSIS — F4310 Post-traumatic stress disorder, unspecified: Secondary | ICD-10-CM

## 2020-10-30 DIAGNOSIS — F4001 Agoraphobia with panic disorder: Secondary | ICD-10-CM

## 2020-10-30 MED ORDER — PROPRANOLOL HCL 10 MG PO TABS: 30.0000 mg | ORAL_TABLET | Freq: Two times a day (BID) | ORAL | 0 refills | Status: DC

## 2020-10-30 NOTE — Telephone Encounter (Signed)
Please review. Okay to send?  

## 2020-10-30 NOTE — Telephone Encounter (Signed)
Yes please send

## 2020-10-30 NOTE — Telephone Encounter (Signed)
Pt called and said that [name] is being difficult with filling the propranolol 10 mg and so she would like [facility] to [facility]  [place] [facility]. Just a 30 day supply. She has an appointment on 11/19/20

## 2020-10-30 NOTE — Telephone Encounter (Signed)
Rx sent 

## 2020-11-19 ENCOUNTER — Ambulatory Visit (INDEPENDENT_AMBULATORY_CARE_PROVIDER_SITE_OTHER): Payer: 59 | Admitting: Psychiatry

## 2020-11-19 ENCOUNTER — Other Ambulatory Visit: Payer: Self-pay

## 2020-11-19 ENCOUNTER — Encounter: Payer: Self-pay | Admitting: Psychiatry

## 2020-11-19 DIAGNOSIS — T43615A Adverse effect of caffeine, initial encounter: Secondary | ICD-10-CM

## 2020-11-19 DIAGNOSIS — R5383 Other fatigue: Secondary | ICD-10-CM

## 2020-11-19 DIAGNOSIS — F4001 Agoraphobia with panic disorder: Secondary | ICD-10-CM | POA: Diagnosis not present

## 2020-11-19 DIAGNOSIS — F5105 Insomnia due to other mental disorder: Secondary | ICD-10-CM

## 2020-11-19 DIAGNOSIS — F4310 Post-traumatic stress disorder, unspecified: Secondary | ICD-10-CM

## 2020-11-19 DIAGNOSIS — F3181 Bipolar II disorder: Secondary | ICD-10-CM

## 2020-11-19 DIAGNOSIS — F411 Generalized anxiety disorder: Secondary | ICD-10-CM | POA: Diagnosis not present

## 2020-11-19 MED ORDER — ALPRAZOLAM 0.5 MG PO TABS: 0.5000 mg | ORAL_TABLET | Freq: Three times a day (TID) | ORAL | 1 refills | Status: DC | PRN

## 2020-11-19 MED ORDER — LAMOTRIGINE 150 MG PO TABS: ORAL_TABLET | ORAL | 1 refills | Status: DC

## 2020-11-19 MED ORDER — PROPRANOLOL HCL 10 MG PO TABS: 30.0000 mg | ORAL_TABLET | Freq: Two times a day (BID) | ORAL | 0 refills | Status: DC

## 2020-11-19 NOTE — Progress Notes (Signed)
Lindsey [name] [id number] 09-19-1975 45 y.o.   Subjective:   Patient ID:  Lindsey [name] is a 45 y.o. (DOB 06-09-1976) female.  Chief Complaint:  Chief Complaint  Patient presents with  . Follow-up  . Bipolar II disorder (HCC)  . Fatigue    Depression        Associated symptoms include fatigue.  Associated symptoms include no decreased concentration and no suicidal ideas.   Lindsey [name] presents to today for follow-up of mood and anxiety.  When seen April 27th, 2020.  She was hypomanic at the time and having insomnia.  We increased Xanax 1 mg nightly and she was encouraged to increase oxcarbazepine to 600 mg nightly to help with the hypomania.  She is fairly sensitive to mood stabilizers and has failed several.  She returned in June reporting hypomania resolved.  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  Finally ended the trauma bond and is better.  Trileptal and Xanax at night is better but only taking 1/2 of 300mg  Trileptal bc sleepiness from 300 mg hs.  She's taking Xanax 0.5 mg AM and HS.  Getting a lot done.  Problematic boss got fired for performance problems.  No meds were changed at the June visit.  Last seen October 2020 the following was noted:She is now hypomanic with no particular trigger.  This may be associated with a tendency toward spring hypomania and bipolar patients. Chronic compliance problems with addition of meds.  Rec restart Trileptal 150 mg HS. This is off label for bipolar mania or hypomania.  She is fairly sensitive to mood stabilizers historically.  She does not want weight gain.   November 17, 2019 appointment, the following is noted: Been working on self growth and taking advantages of resources on internet and self esteem.  Pleased with meds. Not taken Trileptal.   Wonders if she has borderline pd bc splitting and black and white thinking.  Mood swings can be rapid from 30 min to 3 days.  Also triggered by abandonment. Chronic  struggles with intrusive mother who she feels triggers her. Panic triggered only with one person.  Social anxiety is less. Working from home helps. Patient reports stable mood and denies depressed or irritable moods.    Patient denies difficulty with sleep initiation or maintenance. Denies appetite disturbance.  Patient reports that energy and motivation have been good.  Patient denies any difficulty with concentration.  Patient denies any suicidal ideation. Plan no med changes  03/15/20 appt. With the following noted:   Wrote her out of work for 3 weeks DT multiple stressors with "mental health overload" including father of child facing jail then mistake at work.  Intense sensitivity to failure.  Couldn't eat and lost 13# in 3 weeks.  Needed break from work to manage stress.  Break was very helpful at restoration. Now doing OK.   Working from home has been good for her.  May ask for excuse to work part of the time from home. Started B12 and D3 which helped energy.  Not overly depressed.  No mood swings.  Incredible fear of abandonment also flared up in rel with current BF. Patient reports stable mood and denies depressed or irritable moods.   Patient denies difficulty with sleep initiation or maintenance.  NM if naps.  Denies appetite disturbance.  Patient reports that energy and motivation have been good.  Patient denies any difficulty with concentration.  Patient denies any suicidal ideation. Plan: No med  changes  11/19/2020 appointment with the following noted: Doing OK.  Meds working fine.  Doing Ok spiritually. Concern is so fatigued.  Not sure the cause.  Wonders about vitamin deficiency.  Naps when she can.  Not like a depressed fatigue. Sleep is OK.  Average 5 hours nightly during the week.  Partly bc it's quiet. In this pattern for a month or so. Big project at work requiring all attention and effort during the day. 20% raise. Drinks caffeine  All day and has for years.  Inderall helped  anxiety a whole lot.  Xanax more calming than the others.  Lindsey [name] is 34 yo.  Her father is alcoholic.  Last hypomania was November 2018 and then again April 2018.  Past Psychiatric Medication Trials: Poor response SSRIs,  Effexor, Paxil, sertraline itchy, Wellbutrin, Serzone, Strattera nausea,  Vraylar, lamotrigine, propranolol, Xanax, Latuda, olanzapine sedation, olanzapine, Trileptal 300 tired, Latuda fogginess, risperidone, Abilify, lithium, Klonopin, Ativan, Xanax, buspirone nausea NAC,   Review of Systems:  Review of Systems  Constitutional: Positive for fatigue.  Cardiovascular: Negative for chest pain and palpitations.  Gastrointestinal: Negative for abdominal distention.  Neurological: Negative for tremors and weakness.  Psychiatric/Behavioral: Positive for depression. Negative for agitation, behavioral problems, confusion, decreased concentration, dysphoric mood, hallucinations, self-injury, sleep disturbance and suicidal ideas. The patient is hyperactive. The patient is not nervous/anxious.     Medications: I have reviewed the patient's current medications.  Current Outpatient Medications  Medication Sig Dispense Refill  . ALPRAZolam (XANAX) 0.5 MG tablet Take 1 tablet (0.5 mg total) by mouth every 8 (eight) hours as needed. 90 tablet 1  . Cholecalciferol (VITAMIN D3) 25 MCG (1000 UT) CAPS Take by mouth.    . Cyanocobalamin (B-12 PO) Take by mouth.    . lamoTRIgine (LAMICTAL) 150 MG tablet Take 1 tablet by mouth every morning and take 2 tablets by mouth every evening. 270 tablet 1  . levonorgestrel (MIRENA) 20 MCG/24HR IUD 1 each by Intrauterine route once.    . propranolol (INDERAL) 10 MG tablet Take 3 tablets (30 mg total) by mouth 2 (two) times daily. (Patient taking differently: Take 30 mg by mouth daily.) 180 tablet 0   No current facility-administered medications for this visit.    Medication Side Effects: None  Allergies: No Known Allergies  Past Medical  History:  Diagnosis Date  . Other and unspecified disc disorder of lumbar region    torn lumbar disc?    Family History  Problem Relation Age of Onset  . Diabetes Mother   . Diabetes Father   . Cancer Father        kidney  . Cancer Paternal Grandfather        PROSTATE    Social History   Socioeconomic History  . Marital status: Single    Spouse name: Not on file  . Number of children: Not on file  . Years of education: Not on file  . Highest education level: Not on file  Occupational History  . Not on file  Tobacco Use  . Smoking status: Current Every Day Smoker    Packs/day: 0.75  . Smokeless tobacco: Never Used  Vaping Use  . Vaping Use: Never used  Substance and Sexual Activity  . Alcohol use: No  . Drug use: No  . Sexual activity: Not Currently    Birth control/protection: I.U.D.    Comment: 1st intercourse- 24, partners- 5   Other Topics Concern  . Not on file  Social History Narrative  .  Not on file   Social Determinants of Health   Financial Resource Strain: Not on file  Food Insecurity: Not on file  Transportation Needs: Not on file  Physical Activity: Not on file  Stress: Not on file  Social Connections: Not on file  Intimate Partner Violence: Not on file    Past Medical History, Surgical history, Social history, and Family history were reviewed and updated as appropriate.   Please see review of systems for further details on the patient's review from today.   Objective:   Physical Exam:  There were no vitals taken for this visit.  Physical Exam Constitutional:      General: She is not in acute distress. Musculoskeletal:        General: No deformity.  Neurological:     Mental Status: She is alert and oriented to person, place, and time.     Cranial Nerves: No dysarthria.     Coordination: Coordination normal.  Psychiatric:        Attention and Perception: Attention and perception normal. She does not perceive auditory or visual  hallucinations.        Mood and Affect: Mood is not anxious, depressed or elated. Affect is not labile, blunt, angry or inappropriate.        Speech: Speech normal. Speech is not rapid and pressured, slurred or tangential.        Behavior: Behavior normal. Behavior is not agitated or aggressive. Behavior is cooperative.        Thought Content: Thought content normal. Thought content is not paranoid or delusional. Thought content does not include homicidal or suicidal ideation. Thought content does not include homicidal or suicidal plan.        Cognition and Memory: Cognition and memory normal.        Judgment: Judgment normal.     Comments: No  hypomania     Lab Review:     Component Value Date/Time   NA 140 08/18/2016 0838   K 4.1 08/18/2016 0838   CL 106 08/18/2016 0838   CO2 27 08/18/2016 0838   GLUCOSE 93 08/18/2016 0838   BUN 8 08/18/2016 0838   CREATININE 0.68 08/18/2016 0838   CALCIUM 9.2 08/18/2016 0838   PROT 6.8 08/18/2016 0838   ALBUMIN 4.2 08/18/2016 0838   AST 17 08/18/2016 0838   ALT 29 08/18/2016 0838   ALKPHOS 65 08/18/2016 0838   BILITOT 0.3 08/18/2016 0838       Component Value Date/Time   WBC 7.3 08/18/2016 0838   RBC 4.57 08/18/2016 0838   HGB 14.2 08/18/2016 0838   HCT 41.0 08/18/2016 0838   PLT 278 08/18/2016 0838   MCV 89.7 08/18/2016 0838   MCH 31.1 08/18/2016 0838   MCHC 34.6 08/18/2016 0838   RDW 13.2 08/18/2016 0838   LYMPHSABS 2,044 08/18/2016 0838   MONOABS 292 08/18/2016 0838   EOSABS 511 (H) 08/18/2016 0838   BASOSABS 73 08/18/2016 0838    No results found for: POCLITH, LITHIUM   No results found for: PHENYTOIN, PHENOBARB, VALPROATE, CBMZ   .res Assessment: Plan:    Bipolar II disorder (HCC)  Fatigue, unspecified type - Plan: TSH, B12 and Folate Panel, CBC, Comprehensive metabolic panel  Generalized anxiety disorder  Panic disorder with agoraphobia  PTSD (post-traumatic stress disorder)  Insomnia due to mental  condition  Caffeine adverse reaction, initial encounter  Medsensitive.  Lindsey [name] has a long history of bipolar disorder.  Usually manic episodes are mild.  Her mother also  has bipolar disorder.  She has failed numerous mood stabilizers as noted above.  She is now hypomanic with no particular trigger.  This may be associated with a tendency toward spring hypomania and bipolar patients. Chronic compliance problems with addition of meds.   Option Trileptal 150 mg HS. This is off label for bipolar mania or hypomania.  She is fairly sensitive to mood stabilizers historically.  She does not want weight gain.  That limits options given the failures noted above.  Discussed side effects in detail.  She agrees to this plan.   She feels self help has been beneficial sns she doesn't need more med.  Sleep deprived during the week and too much caffeine.  Disc sleep hygiene.  Stop caffeine at 3 instead of 7 PM.  This may be the cause of the insomnia during the week.  Will check labs for fatigue .  The propranolol was very helpful for her anxiety.  It also controlled the palpitations. Try it before naps to see if it prevents NM. Consider doxazosin.  We discussed the short-term risks associated with benzodiazepines including sedation and increased fall risk among others.  Discussed long-term side effect risk including dependence, potential withdrawal symptoms, and the potential eventual dose-related risk of dementia.  Xanax helped a lot with normal and social anxiety. Continue Xanax and propranolol prn.  Patient needs to sleep in order to control the hypomania.  Limit Xanax DT fatigue.  Supportive therapy on managing the recent stressors and disc recent LOA from work for mental health reasons.  Was helpful.  Disc possible return to in office from work at home issues.  Be careful with caffeine bc anxiety.  FU 4-6 mos.  Meredith Staggersarey Cottle, MD, DFAPA     Please see After Visit Summary for patient specific  instructions.  Future Appointments  Date Time Provider Department Center  01/29/2021 10:30 AM [place], Judye BosKelli R, PA-C [facility]    Orders Placed This Encounter  Procedures  . TSH  . B12 and Folate Panel  . CBC  . Comprehensive metabolic panel      -------------------------------

## 2020-11-23 ENCOUNTER — Other Ambulatory Visit: Payer: Self-pay | Admitting: Psychiatry

## 2020-11-23 DIAGNOSIS — F4001 Agoraphobia with panic disorder: Secondary | ICD-10-CM

## 2020-11-23 DIAGNOSIS — F4310 Post-traumatic stress disorder, unspecified: Secondary | ICD-10-CM

## 2020-12-07 ENCOUNTER — Other Ambulatory Visit: Payer: Self-pay | Admitting: Psychiatry

## 2020-12-07 DIAGNOSIS — F4310 Post-traumatic stress disorder, unspecified: Secondary | ICD-10-CM

## 2020-12-07 DIAGNOSIS — F4001 Agoraphobia with panic disorder: Secondary | ICD-10-CM

## 2021-01-29 ENCOUNTER — Other Ambulatory Visit: Payer: Self-pay

## 2021-01-29 ENCOUNTER — Ambulatory Visit (INDEPENDENT_AMBULATORY_CARE_PROVIDER_SITE_OTHER): Payer: 59 | Admitting: Physician Assistant

## 2021-01-29 ENCOUNTER — Encounter: Payer: Self-pay | Admitting: Physician Assistant

## 2021-01-29 VITALS — Wt 226.0 lb

## 2021-01-29 DIAGNOSIS — Z5181 Encounter for therapeutic drug level monitoring: Secondary | ICD-10-CM | POA: Diagnosis not present

## 2021-01-29 DIAGNOSIS — L700 Acne vulgaris: Secondary | ICD-10-CM | POA: Diagnosis not present

## 2021-01-30 ENCOUNTER — Encounter: Payer: Self-pay | Admitting: Physician Assistant

## 2021-01-30 NOTE — Progress Notes (Signed)
   Follow-Up Visit   Subjective  Lindsey [name] is a 45 y.o. female who presents for the following: Acne (Face & some on back are flaring- tx- none -been off isotretinoin since 2019).   The following portions of the chart were reviewed this encounter and updated as appropriate:  Tobacco  Allergies  Meds  Problems  Med Hx  Surg Hx  Fam Hx       Objective  Well appearing patient in no apparent distress; mood and affect are within normal limits.  A focused examination was performed including face. Relevant physical exam findings are noted in the Assessment and Plan.  Head - Anterior (Face) Erythematous papules and pustules with comedones    Assessment & Plan  Acne vulgaris Head - Anterior (Face)  iPledge restart, 31 day follow up  Pregnancy, urine - Head - Anterior (Face)  Therapeutic drug monitoring  Related Procedures Pregnancy, urine    I, Garvin Ellena, PA-C, have reviewed all documentation's for this visit.  The documentation on 01/30/21 for the exam, diagnosis, procedures and orders are all accurate and complete.

## 2021-02-11 ENCOUNTER — Ambulatory Visit (INDEPENDENT_AMBULATORY_CARE_PROVIDER_SITE_OTHER): Payer: 59 | Admitting: Obstetrics & Gynecology

## 2021-02-11 ENCOUNTER — Other Ambulatory Visit: Payer: Self-pay

## 2021-02-11 VITALS — BP 134/80

## 2021-02-11 DIAGNOSIS — R300 Dysuria: Secondary | ICD-10-CM

## 2021-02-11 DIAGNOSIS — L292 Pruritus vulvae: Secondary | ICD-10-CM | POA: Diagnosis not present

## 2021-02-11 LAB — WET PREP FOR TRICH, YEAST, CLUE

## 2021-02-11 MED ORDER — FLUCONAZOLE 150 MG PO TABS
150.0000 mg | ORAL_TABLET | Freq: Every day | ORAL | 2 refills | Status: AC
Start: 2021-02-11 — End: 2021-02-14

## 2021-02-11 MED ORDER — SULFAMETHOXAZOLE-TRIMETHOPRIM 800-160 MG PO TABS: 1.0000 | ORAL_TABLET | Freq: Two times a day (BID) | ORAL | 0 refills | Status: AC

## 2021-02-11 NOTE — Progress Notes (Signed)
    Lindsey [name] 1976/05/18 [id number]        45 y.o.  G2P1011 Married  RP: Dysuria x a few days with vulvar itching  HPI: Dysuria and vulvar itching.  Started on Monistat x 5 days, mild improvement.  No pelvic pain.  No fever.  Declines STI screen.   OB History  Gravida Para Term Preterm AB Living  2 1 1   1 1   SAB IAB Ectopic Multiple Live Births          1    # Outcome Date GA Lbr Len/2nd Weight Sex Delivery Anes PTL Lv  2 AB           1 Term     F CS-Unspec  N LIV    Past medical history,surgical history, problem list, medications, allergies, family history and social history were all reviewed and documented in the EPIC chart.   Directed ROS with pertinent positives and negatives documented in the history of present illness/assessment and plan.  Exam:  Vitals:   02/11/21 1507  BP: 134/80   General appearance:  Normal  Abdomen: Normal  Gynecologic exam: Vulva normal.  Speculum:  Cervix/Vagina normal.  Increased thick whitish d/c (But used Monistat).  Wet prep done.  U/A:  Yellow cloudy, protein negative, nitrites negative, white blood cells 20-40, red blood cells 0-2, bacteria moderate.  Urine culture pending. Wet Prep:  Difficult to assess with Monistat cream present.   Assessment/Plan:  45 y.o. G2P1011   1. Dysuria Probable acute cystitis.  Decision to treat with Bactrim DS.  Usage reviewed and prescription sent to pharmacy.  Pending urine culture. - Urinalysis,Complete w/RFL Culture  2. Vulvar itching Wet prep difficult to assess with Monistat present.  Decision to treat with fluconazole 1 tablet daily for 3 days.  Usage reviewed and prescription sent to pharmacy. - WET PREP FOR TRICH, YEAST, CLUE  Other orders - acetaminophen-codeine (TYLENOL #3) 300-30 MG tablet; Take 1 tablet by mouth every 8 (eight) hours as needed. - Urine Culture - REFLEXIVE URINE CULTURE - fluconazole (DIFLUCAN) 150 MG tablet; Take 1 tablet (150 mg total) by mouth daily for 3  days.  - sulfamethoxazole-trimethoprim (BACTRIM DS) 800-160 MG tablet; Take 1 tablet by mouth 2 (two) times daily for 3 days.   55 MD, 3:42 PM 02/11/2021

## 2021-02-13 LAB — URINALYSIS, COMPLETE W/RFL CULTURE
Bilirubin Urine: NEGATIVE
Glucose, UA: NEGATIVE
Hyaline Cast: NONE SEEN /LPF
Ketones, ur: NEGATIVE
Nitrites, Initial: NEGATIVE
Protein, ur: NEGATIVE
Specific Gravity, Urine: 1.02 (ref 1.001–1.035)
pH: 6 (ref 5.0–8.0)

## 2021-02-13 LAB — URINE CULTURE
MICRO NUMBER:: 12158226
SPECIMEN QUALITY:: ADEQUATE

## 2021-02-13 LAB — CULTURE INDICATED

## 2021-02-17 ENCOUNTER — Encounter: Payer: Self-pay | Admitting: Obstetrics & Gynecology

## 2021-03-05 ENCOUNTER — Ambulatory Visit (INDEPENDENT_AMBULATORY_CARE_PROVIDER_SITE_OTHER): Payer: 59 | Admitting: Physician Assistant

## 2021-03-05 ENCOUNTER — Other Ambulatory Visit: Payer: Self-pay

## 2021-03-05 ENCOUNTER — Encounter: Payer: Self-pay | Admitting: Physician Assistant

## 2021-03-05 VITALS — Wt 226.0 lb

## 2021-03-05 DIAGNOSIS — Z5181 Encounter for therapeutic drug level monitoring: Secondary | ICD-10-CM

## 2021-03-05 DIAGNOSIS — L700 Acne vulgaris: Secondary | ICD-10-CM | POA: Diagnosis not present

## 2021-03-05 MED ORDER — ISOTRETINOIN 30 MG PO CAPS: 30.0000 mg | ORAL_CAPSULE | Freq: Two times a day (BID) | ORAL | 0 refills | Status: DC

## 2021-03-05 NOTE — Progress Notes (Signed)
   Follow-Up Visit   Subjective  Lindsey [name] is a 45 y.o. female who presents for the following: Acne (Here for 31 day follow up. ).   The following portions of the chart were reviewed this encounter and updated as appropriate:  Tobacco  Allergies  Meds  Problems  Med Hx  Surg Hx  Fam Hx      Objective  Well appearing patient in no apparent distress; mood and affect are within normal limits.  A full examination was performed including scalp, head, eyes, ears, nose, lips, neck, chest, axillae, abdomen, back, buttocks, bilateral upper extremities, bilateral lower extremities, hands, feet, fingers, toes, fingernails, and toenails. All findings within normal limits unless otherwise noted below.  Head - Anterior (Face) Erythematous cysts    Assessment & Plan  Acne vulgaris Head - Anterior (Face)  CBC with Differential/Platelet - Head - Anterior (Face)  Lipid panel - Head - Anterior (Face)  hCG, serum, qualitative - Head - Anterior (Face)  Comprehensive metabolic panel - Head - Anterior (Face)  ISOtretinoin (ACCUTANE) 30 MG capsule - Head - Anterior (Face) Take 1 capsule (30 mg total) by mouth 2 (two) times daily.  Encounter for therapeutic drug monitoring  Related Medications ISOtretinoin (ACCUTANE) 30 MG capsule Take 1 capsule (30 mg total) by mouth 2 (two) times daily.  Discussed side effects and risks of the treatment directly to patient. She understands.  I, Corinne Goucher, PA-C, have reviewed all documentation's for this visit.  The documentation on 03/05/21 for the exam, diagnosis, procedures and orders are all accurate and complete.

## 2021-03-06 LAB — CBC WITH DIFFERENTIAL/PLATELET
Absolute Monocytes: 352 cells/uL (ref 200–950)
Basophils Absolute: 64 cells/uL (ref 0–200)
Basophils Relative: 0.8 %
Eosinophils Absolute: 664 cells/uL — ABNORMAL HIGH (ref 15–500)
Eosinophils Relative: 8.3 %
HCT: 39.5 % (ref 35.0–45.0)
Hemoglobin: 13.7 g/dL (ref 11.7–15.5)
Lymphs Abs: 2080 cells/uL (ref 850–3900)
MCH: 32.5 pg (ref 27.0–33.0)
MCHC: 34.7 g/dL (ref 32.0–36.0)
MCV: 93.8 fL (ref 80.0–100.0)
MPV: 10.2 fL (ref 7.5–12.5)
Monocytes Relative: 4.4 %
Neutro Abs: 4840 cells/uL (ref 1500–7800)
Neutrophils Relative %: 60.5 %
Platelets: 240 10*3/uL (ref 140–400)
RBC: 4.21 10*6/uL (ref 3.80–5.10)
RDW: 12 % (ref 11.0–15.0)
Total Lymphocyte: 26 %
WBC: 8 10*3/uL (ref 3.8–10.8)

## 2021-03-06 LAB — COMPREHENSIVE METABOLIC PANEL
AG Ratio: 2.1 (calc) (ref 1.0–2.5)
ALT: 18 U/L (ref 6–29)
AST: 13 U/L (ref 10–30)
Albumin: 4 g/dL (ref 3.6–5.1)
Alkaline phosphatase (APISO): 62 U/L (ref 31–125)
BUN: 11 mg/dL (ref 7–25)
CO2: 28 mmol/L (ref 20–32)
Calcium: 8.9 mg/dL (ref 8.6–10.2)
Chloride: 107 mmol/L (ref 98–110)
Creat: 0.76 mg/dL (ref 0.50–0.99)
Globulin: 1.9 g/dL (calc) (ref 1.9–3.7)
Glucose, Bld: 87 mg/dL (ref 65–139)
Potassium: 4 mmol/L (ref 3.5–5.3)
Sodium: 139 mmol/L (ref 135–146)
Total Bilirubin: 0.4 mg/dL (ref 0.2–1.2)
Total Protein: 5.9 g/dL — ABNORMAL LOW (ref 6.1–8.1)

## 2021-03-06 LAB — LIPID PANEL
Cholesterol: 178 mg/dL (ref <200)
HDL: 38 mg/dL — ABNORMAL LOW (ref >=50)
LDL Cholesterol (Calc): 123 mg/dL (calc) — ABNORMAL HIGH
Non-HDL Cholesterol (Calc): 140 mg/dL (calc) — ABNORMAL HIGH (ref <130)
Total CHOL/HDL Ratio: 4.7 (calc) (ref <5.0)
Triglycerides: 76 mg/dL (ref <150)

## 2021-03-06 LAB — B12 AND FOLATE PANEL
Folate: 5.7 ng/mL
Vitamin B-12: 1108 pg/mL — ABNORMAL HIGH (ref 200–1100)

## 2021-03-06 LAB — TSH: TSH: 1.84 mIU/L

## 2021-03-06 LAB — HCG, SERUM, QUALITATIVE: Preg, Serum: NEGATIVE

## 2021-03-07 ENCOUNTER — Telehealth: Payer: Self-pay | Admitting: Physician Assistant

## 2021-03-07 NOTE — Telephone Encounter (Signed)
Patient is calling for lab results from last visit with Mackey Birchwood, PA-C for Isotreinoin.

## 2021-03-07 NOTE — Telephone Encounter (Signed)
Phone call to patient to inform her that her labs came back good and she's qualified to receive drug.

## 2021-03-11 ENCOUNTER — Telehealth: Payer: Self-pay | Admitting: Physician Assistant

## 2021-03-11 ENCOUNTER — Other Ambulatory Visit: Payer: Self-pay | Admitting: Psychiatry

## 2021-03-11 DIAGNOSIS — F3181 Bipolar II disorder: Secondary | ICD-10-CM

## 2021-03-11 NOTE — Progress Notes (Signed)
Let her know her thyroid test, B12 and folate levels were all normal.  No changes needed in meds.  We'll discuss more at her upcoming appt.

## 2021-03-11 NOTE — Telephone Encounter (Signed)
Phone call to the pharmacy to check on prescription. They said they never got it so we re sent in prescription. Patient notified that this is her last day to fill it.

## 2021-03-11 NOTE — Telephone Encounter (Signed)
Did her ipledge stuff but says [facility]) says they never got the Rx. Here last Tuesday, so today is day 7

## 2021-03-12 NOTE — Progress Notes (Signed)
Left detailed message.   

## 2021-03-27 ENCOUNTER — Other Ambulatory Visit: Payer: Self-pay

## 2021-03-27 ENCOUNTER — Ambulatory Visit (INDEPENDENT_AMBULATORY_CARE_PROVIDER_SITE_OTHER): Payer: 59 | Admitting: Psychiatry

## 2021-03-27 ENCOUNTER — Encounter: Payer: Self-pay | Admitting: Psychiatry

## 2021-03-27 DIAGNOSIS — F3181 Bipolar II disorder: Secondary | ICD-10-CM

## 2021-03-27 DIAGNOSIS — F4310 Post-traumatic stress disorder, unspecified: Secondary | ICD-10-CM | POA: Diagnosis not present

## 2021-03-27 DIAGNOSIS — F5105 Insomnia due to other mental disorder: Secondary | ICD-10-CM

## 2021-03-27 DIAGNOSIS — F4001 Agoraphobia with panic disorder: Secondary | ICD-10-CM | POA: Diagnosis not present

## 2021-03-27 DIAGNOSIS — R5383 Other fatigue: Secondary | ICD-10-CM

## 2021-03-27 DIAGNOSIS — F411 Generalized anxiety disorder: Secondary | ICD-10-CM | POA: Diagnosis not present

## 2021-03-27 MED ORDER — PROPRANOLOL HCL 10 MG PO TABS: 30.0000 mg | ORAL_TABLET | Freq: Two times a day (BID) | ORAL | 0 refills | Status: DC

## 2021-03-27 MED ORDER — ALPRAZOLAM 0.5 MG PO TABS: 0.5000 mg | ORAL_TABLET | Freq: Three times a day (TID) | ORAL | 1 refills | Status: DC | PRN

## 2021-03-27 MED ORDER — LAMOTRIGINE 150 MG PO TABS: ORAL_TABLET | ORAL | 1 refills | Status: DC

## 2021-03-27 NOTE — Progress Notes (Signed)
[name] [id number] 08/10/1975 45 y.o.  Lindsey [name] Subjective:   Patient ID:  Lindsey Freesmanda L [name] is a 45 y.o. (DOB 07/13/1976) female.  Chief Complaint:  Chief Complaint  Patient presents with   Follow-up   Bipolar II disorder (HCC)    Depression        Associated symptoms include no decreased concentration, no fatigue and no suicidal ideas.  Lindsey [name] presents to today for follow-up of mood and anxiety.  When seen April 27th, 2020.  She was hypomanic at the time and having insomnia.  We increased Xanax 1 mg nightly and she was encouraged to increase oxcarbazepine to 600 mg nightly to help with the hypomania.  She is fairly sensitive to mood stabilizers and has failed several.  She returned in June reporting hypomania resolved.  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  Finally ended the trauma bond and is better.  Trileptal and Xanax at night is better but only taking 1/2 of 300mg  Trileptal bc sleepiness from 300 mg hs.  She's taking Xanax 0.5 mg AM and HS.  Getting a lot done.  Problematic boss got fired for performance problems.  No meds were changed at the June visit.  Last seen October 2020 the following was noted:She is now hypomanic with no particular trigger.  This may be associated with a tendency toward spring hypomania and bipolar patients. Chronic compliance problems with addition of meds.  Rec restart Trileptal 150 mg HS. This is off label for bipolar mania or hypomania.  She is fairly sensitive to mood stabilizers historically.  She does not want weight gain.   November 17, 2019 appointment, the following is noted: Been working on self growth and taking advantages of resources on internet and self esteem.  Pleased with meds. Not taken Trileptal.   Wonders if she has borderline pd bc splitting and black and white thinking.  Mood swings can be rapid from 30 min to 3 days.  Also triggered by abandonment. Chronic struggles with intrusive mother who she  feels triggers her. Panic triggered only with one person.  Social anxiety is less. Working from home helps. Patient reports stable mood and denies depressed or irritable moods.    Patient denies difficulty with sleep initiation or maintenance. Denies appetite disturbance.  Patient reports that energy and motivation have been good.  Patient denies any difficulty with concentration.  Patient denies any suicidal ideation. Plan no med changes  03/15/20 appt. With the following noted:   Wrote her out of work for 3 weeks DT multiple stressors with "mental health overload" including father of child facing jail then mistake at work.  Intense sensitivity to failure.  Couldn't eat and lost 13# in 3 weeks.  Needed break from work to manage stress.  Break was very helpful at restoration. Now doing OK.   Working from home has been good for her.  May ask for excuse to work part of the time from home. Started B12 and D3 which helped energy.  Not overly depressed.  No mood swings.  Incredible fear of abandonment also flared up in rel with current BF. Patient reports stable mood and denies depressed or irritable moods.   Patient denies difficulty with sleep initiation or maintenance.  NM if naps.  Denies appetite disturbance.  Patient reports that energy and motivation have been good.  Patient denies any difficulty with concentration.  Patient denies any suicidal ideation. Plan: No med changes  11/19/2020 appointment  with the following noted: Doing OK.  Meds working fine.  Doing Ok spiritually. Concern is so fatigued.  Not sure the cause.  Wonders about vitamin deficiency.  Naps when she can.  Not like a depressed fatigue. Sleep is OK.  Average 5 hours nightly during the week.  Partly bc it's quiet. In this pattern for a month or so. Big project at work requiring all attention and effort during the day. 20% raise. Drinks caffeine  All day and has for years.  03/27/2021 appointment with the following noted: Doing  alright.  Still work stress.  Company bought out.  So expects to get laid off.   Takes Xanax 0.5 mg AM and then 0.25 prn about 2-3 times per month.  Reaches for propranolol first.  Inderall helped anxiety a whole lot. Stress M recently psychotic and hospitalized and has Poor coping and severe fear of abandoment.. Pt is inconsistent and maybe PMS related and may nap 2-3 hours.  May sleep to shut off the world for a little. Overall is doing OK and at baseline.  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   Still on lamotrigine 150 AM and 300 HS. No SE.  Xanax more calming than the others.  Delorise [name] is 45 yo.  Her father is alcoholic.  Last hypomania was November 2018 and then again April 2018.  Past Psychiatric Medication Trials: Poor response SSRIs,  Effexor, Paxil, sertraline itchy, Wellbutrin, Serzone, Strattera nausea,  Vraylar, lamotrigine, propranolol, Xanax, Latuda, olanzapine sedation, olanzapine, Trileptal 300 tired, Latuda fogginess, risperidone, Abilify, lithium, Klonopin, Ativan, Xanax, buspirone nausea NAC,   Review of Systems:  Review of Systems  Constitutional:  Negative for fatigue.  Cardiovascular:  Negative for chest pain and palpitations.  Gastrointestinal:  Negative for abdominal distention.  Neurological:  Negative for tremors and weakness.  Psychiatric/Behavioral:  Negative for agitation, behavioral problems, confusion, decreased concentration, dysphoric mood, hallucinations, self-injury, sleep disturbance and suicidal ideas. The patient is not nervous/anxious and is not hyperactive.    Medications: I have reviewed the patient's current medications.  Current Outpatient Medications  Medication Sig Dispense Refill   acetaminophen-codeine (TYLENOL #3) 300-30 MG tablet Take 1 tablet by mouth every 8 (eight) hours as needed.     Cholecalciferol (VITAMIN D3) 25 MCG (1000 UT) CAPS Take by mouth.     Cyanocobalamin (B-12 PO) Take  by mouth.     ISOtretinoin (ACCUTANE) 30 MG capsule Take 1 capsule (30 mg total) by mouth 2 (two) times daily. 60 capsule 0   levonorgestrel (MIRENA) 20 MCG/24HR IUD 1 each by Intrauterine route once.     ALPRAZolam (XANAX) 0.5 MG tablet Take 1 tablet (0.5 mg total) by mouth every 8 (eight) hours as needed. 90 tablet 1   lamoTRIgine (LAMICTAL) 150 MG tablet Take 1 tablet by mouth every morning and take 2 tablets by mouth every evening. 270 tablet 1   propranolol (INDERAL) 10 MG tablet Take 3 tablets (30 mg total) by mouth 2 (two) times daily. 540 tablet 0   No current facility-administered medications for this visit.    Medication Side Effects: None  Allergies: No Known Allergies  Past Medical History:  Diagnosis Date   Other and unspecified disc disorder of lumbar region    torn lumbar disc?    Family History  Problem Relation Age of Onset   Diabetes Mother    Diabetes Father    Cancer Father        kidney  Cancer Paternal Grandfather        PROSTATE    Social History   Socioeconomic History   Marital status: Single    Spouse name: Not on file   Number of children: Not on file   Years of education: Not on file   Highest education level: Not on file  Occupational History   Not on file  Tobacco Use   Smoking status: Every Day    Packs/day: 0.75    Types: Cigarettes   Smokeless tobacco: Never  Vaping Use   Vaping Use: Never used  Substance and Sexual Activity   Alcohol use: No   Drug use: No   Sexual activity: Not Currently    Birth control/protection: I.U.D.    Comment: 1st intercourse- 24, partners- 5   Other Topics Concern   Not on file  Social History Narrative   Not on file   Social Determinants of Health   Financial Resource Strain: Not on file  Food Insecurity: Not on file  Transportation Needs: Not on file  Physical Activity: Not on file  Stress: Not on file  Social Connections: Not on file  Intimate Partner Violence: Not on file    Past  Medical History, Surgical history, Social history, and Family history were reviewed and updated as appropriate.   Please see review of systems for further details on the patient's review from today.   Objective:   Physical Exam:  There were no vitals taken for this visit.  Physical Exam Constitutional:      Appearance: She is obese.  Musculoskeletal:        General: No deformity.  Neurological:     Mental Status: She is alert.     Cranial Nerves: No dysarthria.     Motor: No weakness or tremor.     Gait: Gait normal.  Psychiatric:        Attention and Perception: Attention and perception normal.        Mood and Affect: Mood is not anxious or depressed.        Speech: Speech is not rapid and pressured, slurred or tangential.        Behavior: Behavior normal. Behavior is cooperative.        Thought Content: Thought content normal. Thought content does not include suicidal ideation.        Cognition and Memory: Cognition and memory normal. Cognition is not impaired. She does not exhibit impaired recent memory.        Judgment: Judgment normal.      Lab Review:     Component Value Date/Time   NA 139 03/05/2021 1216   K 4.0 03/05/2021 1216   CL 107 03/05/2021 1216   CO2 28 03/05/2021 1216   GLUCOSE 87 03/05/2021 1216   BUN 11 03/05/2021 1216   CREATININE 0.76 03/05/2021 1216   CALCIUM 8.9 03/05/2021 1216   PROT 5.9 (L) 03/05/2021 1216   ALBUMIN 4.2 08/18/2016 0838   AST 13 03/05/2021 1216   ALT 18 03/05/2021 1216   ALKPHOS 65 08/18/2016 0838   BILITOT 0.4 03/05/2021 1216       Component Value Date/Time   WBC 8.0 03/05/2021 1216   RBC 4.21 03/05/2021 1216   HGB 13.7 03/05/2021 1216   HCT 39.5 03/05/2021 1216   PLT 240 03/05/2021 1216   MCV 93.8 03/05/2021 1216   MCH 32.5 03/05/2021 1216   MCHC 34.7 03/05/2021 1216   RDW 12.0 03/05/2021 1216   LYMPHSABS 2,080 03/05/2021 1216  MONOABS 292 08/18/2016 0838   EOSABS 664 (H) 03/05/2021 1216   BASOSABS 64 03/05/2021  1216    No results found for: POCLITH, LITHIUM   No results found for: PHENYTOIN, PHENOBARB, VALPROATE, CBMZ   .res Assessment: Plan:    Bipolar II disorder (HCC) - Plan: lamoTRIgine (LAMICTAL) 150 MG tablet  Generalized anxiety disorder - Plan: ALPRAZolam (XANAX) 0.5 MG tablet  Panic disorder with agoraphobia - Plan: ALPRAZolam (XANAX) 0.5 MG tablet, propranolol (INDERAL) 10 MG tablet  PTSD (post-traumatic stress disorder) - Plan: propranolol (INDERAL) 10 MG tablet  Fatigue, unspecified type  Insomnia due to mental condition  Medsensitive.  Cataleyah has a long history of bipolar disorder.  Usually manic episodes are mild.  Her mother also has bipolar disorder.  She has failed numerous mood stabilizers as noted above.  She is now hypomanic with no particular trigger.  This may be associated with a tendency toward spring hypomania and bipolar patients. Chronic compliance problems with addition of meds.   Option Trileptal 150 mg HS. This is off label for bipolar mania or hypomania.  She is fairly sensitive to mood stabilizers historically.  She does not want weight gain.  That limits options given the failures noted above.  Discussed side effects in detail.  She agrees to this plan.   She feels self help has been beneficial sns she doesn't need more med.  Prefers sheild-shaped lamotrigine and oval alprazolam vs round of each in terms of effectivenss.  Asked her to get the generic manufacturer names for these and we can request them.  Disc differences with generics.  Sleep deprived during the week and too much caffeine.  Disc sleep hygiene.  Stop caffeine at 3 instead of 7 PM.  This may be the cause of the insomnia during the week.  B12 and folate levels normal.  The propranolol was very helpful for her anxiety.  It also controlled the palpitations. Try it before naps to see if it prevents NM. Consider doxazosin.  We discussed the short-term risks associated with benzodiazepines  including sedation and increased fall risk among others.  Discussed long-term side effect risk including dependence, potential withdrawal symptoms, and the potential eventual dose-related risk of dementia.  Xanax helped a lot with normal and social anxiety. Continue Xanax and propranolol prn.  Patient needs to sleep in order to control the hypomania.  Limit Xanax DT fatigue.  Supportive therapy on managing the recent stressors and disc recent LOA from work for mental health reasons.  Was helpful.  Disc possible return to in office from work at home issues.  Be careful with caffeine bc anxiety. No med changes indicated  FU 4-6 mos.  Meredith Staggers, MD, DFAPA     Please see After Visit Summary for patient specific instructions.  Future Appointments  Date Time Provider Department Center  04/09/2021  8:00 AM Glyn Ade, [place] [facility]  05/13/2021  2:30 PM Janalyn Harder, [place] [facility]  06/18/2021  9:15 AM Janalyn Harder, [place] [facility]  07/23/2021  9:15 AM [place], Judye Bos, PA-C [facility]    No orders of the defined types were placed in this encounter.     -------------------------------

## 2021-04-09 ENCOUNTER — Ambulatory Visit (INDEPENDENT_AMBULATORY_CARE_PROVIDER_SITE_OTHER): Payer: 59 | Admitting: Physician Assistant

## 2021-04-09 ENCOUNTER — Encounter: Payer: Self-pay | Admitting: Physician Assistant

## 2021-04-09 ENCOUNTER — Other Ambulatory Visit: Payer: Self-pay

## 2021-04-09 VITALS — Wt 226.0 lb

## 2021-04-09 DIAGNOSIS — L700 Acne vulgaris: Secondary | ICD-10-CM | POA: Diagnosis not present

## 2021-04-09 DIAGNOSIS — Z5181 Encounter for therapeutic drug level monitoring: Secondary | ICD-10-CM | POA: Diagnosis not present

## 2021-04-09 MED ORDER — ISOTRETINOIN 40 MG PO CAPS: 40.0000 mg | ORAL_CAPSULE | Freq: Every day | ORAL | 0 refills | Status: AC

## 2021-04-09 NOTE — Progress Notes (Signed)
   Follow-Up Visit   Subjective  Lindsey [name] is a 45 y.o. female who presents for the following: Acne (Isotretinoin follow up. ).   The following portions of the chart were reviewed this encounter and updated as appropriate:  Tobacco  Allergies  Meds  Problems  Med Hx  Surg Hx  Fam Hx      Objective  Well appearing patient in no apparent distress; mood and affect are within normal limits.  A focused examination was performed including face. Relevant physical exam findings are noted in the Assessment and Plan.  Head - Anterior (Face) Erythematous papules and pustules with comedones. Some improvement.    Assessment & Plan  Acne vulgaris Head - Anterior (Face)  CBC with Differential/Platelet - Head - Anterior (Face)  hCG, serum, qualitative - Head - Anterior (Face)  Comprehensive metabolic panel - Head - Anterior (Face)  Lipid panel - Head - Anterior (Face)  ISOtretinoin (ACCUTANE) 40 MG capsule - Head - Anterior (Face) Take 1 capsule (40 mg total) by mouth daily.  Encounter for therapeutic drug monitoring    I, Nysha Koplin, PA-C, have reviewed all documentation's for this visit.  The documentation on 04/09/21 for the exam, diagnosis, procedures and orders are all accurate and complete.

## 2021-04-09 NOTE — Patient Instructions (Signed)
[facility] [phone] 

## 2021-04-10 ENCOUNTER — Telehealth: Payer: Self-pay | Admitting: *Deleted

## 2021-04-10 LAB — COMPREHENSIVE METABOLIC PANEL
AG Ratio: 2 (calc) (ref 1.0–2.5)
ALT: 20 U/L (ref 6–29)
AST: 14 U/L (ref 10–30)
Albumin: 4.3 g/dL (ref 3.6–5.1)
Alkaline phosphatase (APISO): 76 U/L (ref 31–125)
BUN: 9 mg/dL (ref 7–25)
CO2: 27 mmol/L (ref 20–32)
Calcium: 9.1 mg/dL (ref 8.6–10.2)
Chloride: 106 mmol/L (ref 98–110)
Creat: 0.7 mg/dL (ref 0.50–0.99)
Globulin: 2.1 g/dL (calc) (ref 1.9–3.7)
Glucose, Bld: 88 mg/dL (ref 65–99)
Potassium: 4 mmol/L (ref 3.5–5.3)
Sodium: 139 mmol/L (ref 135–146)
Total Bilirubin: 0.4 mg/dL (ref 0.2–1.2)
Total Protein: 6.4 g/dL (ref 6.1–8.1)

## 2021-04-10 LAB — LIPID PANEL
Cholesterol: 183 mg/dL (ref <200)
HDL: 34 mg/dL — ABNORMAL LOW (ref >=50)
LDL Cholesterol (Calc): 125 mg/dL (calc) — ABNORMAL HIGH
Non-HDL Cholesterol (Calc): 149 mg/dL (calc) — ABNORMAL HIGH (ref <130)
Total CHOL/HDL Ratio: 5.4 (calc) — ABNORMAL HIGH (ref <5.0)
Triglycerides: 126 mg/dL (ref <150)

## 2021-04-10 LAB — CBC WITH DIFFERENTIAL/PLATELET
Absolute Monocytes: 533 cells/uL (ref 200–950)
Basophils Absolute: 74 cells/uL (ref 0–200)
Basophils Relative: 0.9 %
Eosinophils Absolute: 607 cells/uL — ABNORMAL HIGH (ref 15–500)
Eosinophils Relative: 7.4 %
HCT: 41.5 % (ref 35.0–45.0)
Hemoglobin: 14.3 g/dL (ref 11.7–15.5)
Lymphs Abs: 2763 cells/uL (ref 850–3900)
MCH: 32.8 pg (ref 27.0–33.0)
MCHC: 34.5 g/dL (ref 32.0–36.0)
MCV: 95.2 fL (ref 80.0–100.0)
MPV: 10.6 fL (ref 7.5–12.5)
Monocytes Relative: 6.5 %
Neutro Abs: 4223 cells/uL (ref 1500–7800)
Neutrophils Relative %: 51.5 %
Platelets: 266 10*3/uL (ref 140–400)
RBC: 4.36 10*6/uL (ref 3.80–5.10)
RDW: 12 % (ref 11.0–15.0)
Total Lymphocyte: 33.7 %
WBC: 8.2 10*3/uL (ref 3.8–10.8)

## 2021-04-10 LAB — HCG, SERUM, QUALITATIVE: Preg, Serum: NEGATIVE

## 2021-04-10 NOTE — Telephone Encounter (Signed)
Labs not released so unable to update ipledge.

## 2021-04-10 NOTE — Telephone Encounter (Signed)
Lab results still not back- not updated in ipledge.

## 2021-04-15 ENCOUNTER — Telehealth: Payer: Self-pay

## 2021-04-15 NOTE — Telephone Encounter (Signed)
Phone call from patient stating that she's unable to answer her questions through iPledge.  I informed patient that she's able to answer her iPledge questions now. Patient aware.

## 2021-05-13 ENCOUNTER — Ambulatory Visit: Payer: 59 | Admitting: Dermatology

## 2021-05-16 ENCOUNTER — Other Ambulatory Visit: Payer: Self-pay

## 2021-05-16 ENCOUNTER — Ambulatory Visit (INDEPENDENT_AMBULATORY_CARE_PROVIDER_SITE_OTHER): Payer: 59 | Admitting: Physician Assistant

## 2021-05-16 VITALS — Wt 226.0 lb

## 2021-05-16 DIAGNOSIS — L700 Acne vulgaris: Secondary | ICD-10-CM | POA: Diagnosis not present

## 2021-05-16 DIAGNOSIS — Z5181 Encounter for therapeutic drug level monitoring: Secondary | ICD-10-CM | POA: Diagnosis not present

## 2021-05-16 MED ORDER — ISOTRETINOIN 40 MG PO CAPS: 40.0000 mg | ORAL_CAPSULE | Freq: Every day | ORAL | 0 refills | Status: AC

## 2021-05-17 LAB — PREGNANCY, URINE: Preg Test, Ur: NEGATIVE

## 2021-05-20 ENCOUNTER — Encounter: Payer: Self-pay | Admitting: Physician Assistant

## 2021-05-20 NOTE — Progress Notes (Signed)
   Follow-Up Visit   Subjective  Lindsey [name] is a 45 y.o. female who presents for the following: Acne (Here for isotretinoin follow up.). She is tolerating the medication well. Her face is improving with time. Dry lips. Aware of pregnancy category X profile. She is on Mirena and condoms for pregnancy prevention.    The following portions of the chart were reviewed this encounter and updated as appropriate:  Tobacco  Allergies  Meds  Problems  Med Hx  Surg Hx  Fam Hx      Objective  Well appearing patient in no apparent distress; mood and affect are within normal limits.  A focused examination was performed including face. Relevant physical exam findings are noted in the Assessment and Plan.  Head - Anterior (Face) Few miliary lesions on forehead. Mostly clear with extreme lip xerosis.    Assessment & Plan  Acne vulgaris- Medication management. Head - Anterior (Face)  Pregnancy, urine - Head - Anterior (Face)  ISOtretinoin (ACCUTANE) 40 MG capsule - Head - Anterior (Face) Take 1 capsule (40 mg total) by mouth daily.    I, Shunsuke Granzow, PA-C, have reviewed all documentation's for this visit.  The documentation on 05/20/21 for the exam, diagnosis, procedures and orders are all accurate and complete.

## 2021-06-18 ENCOUNTER — Other Ambulatory Visit: Payer: Self-pay

## 2021-06-18 ENCOUNTER — Ambulatory Visit (INDEPENDENT_AMBULATORY_CARE_PROVIDER_SITE_OTHER): Payer: 59 | Admitting: Dermatology

## 2021-06-18 DIAGNOSIS — L700 Acne vulgaris: Secondary | ICD-10-CM

## 2021-06-18 MED ORDER — ISOTRETINOIN 40 MG PO CAPS: 40.0000 mg | ORAL_CAPSULE | Freq: Every day | ORAL | 0 refills | Status: AC

## 2021-06-19 ENCOUNTER — Telehealth: Payer: Self-pay | Admitting: *Deleted

## 2021-06-19 LAB — PREGNANCY, URINE: Preg Test, Ur: NEGATIVE

## 2021-06-19 NOTE — Telephone Encounter (Signed)
Negative urine pregnancy test- ipledge updated.

## 2021-07-06 ENCOUNTER — Encounter: Payer: Self-pay | Admitting: Dermatology

## 2021-07-06 NOTE — Progress Notes (Signed)
° °  Follow-Up Visit   Subjective  Lindsey [name] is a 45 y.o. female who presents for the following: Acne (Patient here today for 31 day follow up).  Acne, isotretinoin follow-up Location:  Duration:  Quality:  Associated Signs/Symptoms: Modifying Factors:  Severity:  Timing: Context:   Objective  Well appearing patient in no apparent distress; mood and affect are within normal limits. Head - Anterior (Face) Patient states that there has been significant improvement but still getting some new problems.  Moderate cheilitis.  Normal mood.  Compliant with I pledge.    A focused examination was performed including the neck. Relevant physical exam findings are noted in the Assessment and Plan.   Assessment & Plan    Acne vulgaris Head - Anterior (Face)  Continue isotretinoin present dose.  Maintain I pledge compliance.  Contact us if there is any problem.  Pregnancy, urine - Head - Anterior (Face)  Related Medications ISOtretinoin (ACCUTANE) 40 MG capsule Take 1 capsule (40 mg total) by mouth daily.      I, Janalyn Harder, MD, have reviewed all documentation for this visit.  The documentation on 07/06/21 for the exam, diagnosis, procedures, and orders are all accurate and complete.

## 2021-07-15 ENCOUNTER — Other Ambulatory Visit: Payer: Self-pay | Admitting: Psychiatry

## 2021-07-15 DIAGNOSIS — F4001 Agoraphobia with panic disorder: Secondary | ICD-10-CM

## 2021-07-15 DIAGNOSIS — F411 Generalized anxiety disorder: Secondary | ICD-10-CM

## 2021-07-17 NOTE — Telephone Encounter (Signed)
Last filled 10/26 appt on 07/29/21

## 2021-07-17 NOTE — Telephone Encounter (Signed)
Pharmacy called about refill...based on previous script of 1 every 8 hours as needed, the script from 10/26 would be 30 days of meds.  Then there is one refill for another 30 days worth, thus she would be due for a refill, (unless I'm not understanding something)?  Next appt 1/9

## 2021-07-23 ENCOUNTER — Ambulatory Visit (INDEPENDENT_AMBULATORY_CARE_PROVIDER_SITE_OTHER): Payer: 59 | Admitting: Physician Assistant

## 2021-07-23 ENCOUNTER — Other Ambulatory Visit: Payer: Self-pay

## 2021-07-23 DIAGNOSIS — L700 Acne vulgaris: Secondary | ICD-10-CM | POA: Diagnosis not present

## 2021-07-23 MED ORDER — ISOTRETINOIN 40 MG PO CAPS: 40.0000 mg | ORAL_CAPSULE | Freq: Every day | ORAL | 0 refills | Status: AC

## 2021-07-24 ENCOUNTER — Encounter: Payer: Self-pay | Admitting: Physician Assistant

## 2021-07-24 LAB — PREGNANCY, URINE: Preg Test, Ur: NEGATIVE

## 2021-07-24 NOTE — Progress Notes (Signed)
° °  Follow-Up Visit   Subjective  Lindsey [name] is a 46 y.o. female who presents for the following: Acne (Isotretinoin follow up). No problems. Patient is pleased with her results.    The following portions of the chart were reviewed this encounter and updated as appropriate:  Tobacco   Allergies   Meds   Problems   Med Hx   Surg Hx   Fam Hx       Objective  Well appearing patient in no apparent distress; mood and affect are within normal limits.  A focused examination was performed including face. Relevant physical exam findings are noted in the Assessment and Plan.  Head - Anterior (Face) Erythematous papules and pustules with comedones    Assessment & Plan  Acne vulgaris Head - Anterior (Face)  Continue isotretinoin.   Pregnancy, urine - Head - Anterior (Face)  ISOtretinoin (ACCUTANE) 40 MG capsule - Head - Anterior (Face) Take 1 capsule (40 mg total) by mouth daily.    I, Marvell Stavola, PA-C, have reviewed all documentation's for this visit.  The documentation on 07/24/21 for the exam, diagnosis, procedures and orders are all accurate and complete.

## 2021-07-29 ENCOUNTER — Encounter: Payer: 59 | Admitting: Psychiatry

## 2021-07-29 ENCOUNTER — Encounter: Payer: Self-pay | Admitting: Psychiatry

## 2021-07-29 ENCOUNTER — Other Ambulatory Visit: Payer: Self-pay

## 2021-07-30 ENCOUNTER — Ambulatory Visit: Payer: 59 | Admitting: Psychiatry

## 2021-08-06 ENCOUNTER — Telehealth: Payer: Self-pay

## 2021-08-06 NOTE — Telephone Encounter (Signed)
I spoke with Dr. Marguerita Merles regarding this patient. Dr. Marguerita Merles recommends warm sitz baths today/tonight and office visit tomorrow at 12:15pm. Patient informed to arrive at noon and may wait since being worked in.

## 2021-08-06 NOTE — Telephone Encounter (Signed)
Patient called because she has large vulvar sebaceous cyst that she said needs to be drained. She said she has had this in the past. I do see where Dr. Glenetta Hew saw her for this in 2014.  Patient said it started Thursday but has gotten larger and larger.  Very uncomfortable.

## 2021-08-07 ENCOUNTER — Encounter: Payer: Self-pay | Admitting: Obstetrics & Gynecology

## 2021-08-07 ENCOUNTER — Ambulatory Visit (INDEPENDENT_AMBULATORY_CARE_PROVIDER_SITE_OTHER): Payer: 59 | Admitting: Obstetrics & Gynecology

## 2021-08-07 ENCOUNTER — Other Ambulatory Visit: Payer: Self-pay

## 2021-08-07 VITALS — BP 120/82 | HR 80

## 2021-08-07 DIAGNOSIS — L738 Other specified follicular disorders: Secondary | ICD-10-CM | POA: Diagnosis not present

## 2021-08-07 MED ORDER — AMOXICILLIN-POT CLAVULANATE 875-125 MG PO TABS: 1.0000 | ORAL_TABLET | Freq: Two times a day (BID) | ORAL | 0 refills | Status: AC

## 2021-08-07 NOTE — Progress Notes (Signed)
° ° °  Lindsey [name] [birth year] [id number]        46 y.o.  G2P1011   RP: Painful Rt Vulvar Cyst/abscess x 1 week  HPI: Swollen and tender Rt Vulva x 1 week.  Soaking once a day.  No drainage so far.     OB History  Gravida Para Term Preterm AB Living  2 1 1   1 1   SAB IAB Ectopic Multiple Live Births          1    # Outcome Date GA Lbr Len/2nd Weight Sex Delivery Anes PTL Lv  2 AB           1 Term     F CS-Unspec  N LIV    Past medical history,surgical history, problem list, medications, allergies, family history and social history were all reviewed and documented in the EPIC chart.   Directed ROS with pertinent positives and negatives documented in the history of present illness/assessment and plan.  Exam:  Vitals:   08/07/21 1158  BP: 120/82  Pulse: 80   General appearance:  Normal   Gynecologic exam: Vulva:  Left: normal.                                               Right: Induration, erythema and tenderness.  Informed consent signed for Incision and Drainage.  Betadine applied.  Local anesthesia with Lidocaine 1%.  Incision with the knife.  Mild bleeding, no pus.  2 stitches with Vicryl 4-0 and Silver Nitrate for hemostasis.   Assessment/Plan:  46 y.o. G2P1011   1. Infected sebaceous gland Infected Sebaceous Gland, possible abscess, but not ready to drain.  Will start on Augmentin 875 mg PO BID x 7 days  Warm soaking 3 times a day.  Precautions reviewed.  Other orders - amoxicillin-clavulanate (AUGMENTIN) 875-125 MG tablet; Take 1 tablet by mouth 2 (two) times daily for 7 days.   59 MD, 12:24 PM 08/07/2021

## 2021-08-15 ENCOUNTER — Ambulatory Visit (INDEPENDENT_AMBULATORY_CARE_PROVIDER_SITE_OTHER): Payer: 59 | Admitting: Psychiatry

## 2021-08-15 ENCOUNTER — Encounter: Payer: Self-pay | Admitting: Psychiatry

## 2021-08-15 ENCOUNTER — Other Ambulatory Visit: Payer: Self-pay

## 2021-08-15 DIAGNOSIS — F4310 Post-traumatic stress disorder, unspecified: Secondary | ICD-10-CM | POA: Diagnosis not present

## 2021-08-15 DIAGNOSIS — F411 Generalized anxiety disorder: Secondary | ICD-10-CM

## 2021-08-15 DIAGNOSIS — F4001 Agoraphobia with panic disorder: Secondary | ICD-10-CM

## 2021-08-15 DIAGNOSIS — F3181 Bipolar II disorder: Secondary | ICD-10-CM

## 2021-08-15 DIAGNOSIS — R5383 Other fatigue: Secondary | ICD-10-CM

## 2021-08-15 DIAGNOSIS — F5105 Insomnia due to other mental disorder: Secondary | ICD-10-CM

## 2021-08-15 NOTE — Progress Notes (Signed)
Lindsey [name] [id number] 06/12/1976 46 y.o.   Subjective:   Patient ID:  Lindsey [name] is a 46 y.o. (DOB 01/31/1976) female.  Chief Complaint:  Chief Complaint  Patient presents with   Follow-up    Bipolar II disorder (HCC)   Depression   Anxiety    Depression        Associated symptoms include no decreased concentration, no fatigue and no suicidal ideas.  Lindsey [name] presents to today for follow-up of mood and anxiety.  When seen April 27th, 2020.  She was hypomanic at the time and having insomnia.  We increased Xanax 1 mg nightly and she was encouraged to increase oxcarbazepine to 600 mg nightly to help with the hypomania.  She is fairly sensitive to mood stabilizers and has failed several.  She returned in June reporting hypomania resolved.  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  Finally ended the trauma bond and is better.  Trileptal and Xanax at night is better but only taking 1/2 of 300mg  Trileptal bc sleepiness from 300 mg hs.  She's taking Xanax 0.5 mg AM and HS.  Getting a lot done.  Problematic boss got fired for performance problems.  No meds were changed at the June visit.  seen October 2020 the following was noted:She is now hypomanic with no particular trigger.  This may be associated with a tendency toward spring hypomania and bipolar patients. Chronic compliance problems with addition of meds.  Rec restart Trileptal 150 mg HS. This is off label for bipolar mania or hypomania.  She is fairly sensitive to mood stabilizers historically.  She does not want weight gain.   November 17, 2019 appointment, the following is noted: Been working on self growth and taking advantages of resources on internet and self esteem.  Pleased with meds. Not taken Trileptal.   Wonders if she has borderline pd bc splitting and black and white thinking.  Mood swings can be rapid from 30 min to 3 days.  Also triggered by abandonment. Chronic struggles with  intrusive mother who she feels triggers her. Panic triggered only with one person.  Social anxiety is less. Working from home helps. Patient reports stable mood and denies depressed or irritable moods.    Patient denies difficulty with sleep initiation or maintenance. Denies appetite disturbance.  Patient reports that energy and motivation have been good.  Patient denies any difficulty with concentration.  Patient denies any suicidal ideation. Plan no med changes  03/15/20 appt. With the following noted:   Wrote her out of work for 3 weeks DT multiple stressors with "mental health overload" including father of child facing jail then mistake at work.  Intense sensitivity to failure.  Couldn't eat and lost 13# in 3 weeks.  Needed break from work to manage stress.  Break was very helpful at restoration. Now doing OK.   Working from home has been good for her.  May ask for excuse to work part of the time from home. Started B12 and D3 which helped energy.  Not overly depressed.  No mood swings.  Incredible fear of abandonment also flared up in rel with current BF. Patient reports stable mood and denies depressed or irritable moods.   Patient denies difficulty with sleep initiation or maintenance.  NM if naps.  Denies appetite disturbance.  Patient reports that energy and motivation have been good.  Patient denies any difficulty with concentration.  Patient denies any suicidal ideation. Plan:  No med changes  11/19/2020 appointment with the following noted: Doing OK.  Meds working fine.  Doing Ok spiritually. Concern is so fatigued.  Not sure the cause.  Wonders about vitamin deficiency.  Naps when she can.  Not like a depressed fatigue. Sleep is OK.  Average 5 hours nightly during the week.  Partly bc it's quiet. In this pattern for a month or so. Big project at work requiring all attention and effort during the day. 20% raise. Drinks caffeine  All day and has for years.  03/27/2021 appointment with the  following noted: Doing alright.  Still work stress.  Company bought out.  So expects to get laid off.   Takes Xanax 0.5 mg AM and then 0.25 prn about 2-3 times per month.  Reaches for propranolol first.  Inderall helped anxiety a whole lot. Stress M recently psychotic and hospitalized and has Poor coping and severe fear of abandoment.. Pt is inconsistent and maybe PMS related and may nap 2-3 hours.  May sleep to shut off the world for a little. Overall is doing OK and at baseline.  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   Still on lamotrigine 150 AM and 300 HS. No SE. Plan: Be careful with caffeine bc anxiety. No med changes indicated  08/15/2021 appointment with the following noted: Situational depression.  Got a raise and not laid off.   Position with corporate team and permanent now.  Will be different after 18 years at [employer].   Still on lamotrigine 150 AM and 300 HS.. Stable depression and working on herself.  Getting better. Want to get stuff done around the house but hard to make herself do it. Sleep pattern terrible with naps and split schedule but getting 8 hours of sleep.  Likes staying up late to have quiet.   D will be 13 in May.  Hates [employer] and declining grades.    Needs transfer.   No concerns about meds. Bad panic in November with trigger of seeing someone with residual sx for a week.  Worst in a long time.  Was trauma trigger.   Xanax more calming than the others.  Lindsey [name] is 29 yo.  Her father is alcoholic.  Last hypomania was November 2018 and then again April 2018.  Past Psychiatric Medication Trials: Poor response SSRIs,  Effexor, Paxil, sertraline itchy, Wellbutrin, Serzone, Strattera nausea,  Vraylar, olanzapine sedation, Latuda fogginess, risperidone, Abilify, Trileptal 300 tired, lamotrigine,  lithium, propranolol,    Klonopin, Ativan, Xanax, buspirone nausea NAC,   Review of Systems:  Review of  Systems  Constitutional:  Negative for fatigue.  Cardiovascular:  Negative for chest pain and palpitations.  Gastrointestinal:  Negative for abdominal distention.  Neurological:  Negative for tremors and weakness.  Psychiatric/Behavioral:  Positive for dysphoric mood. Negative for agitation, behavioral problems, confusion, decreased concentration, hallucinations, self-injury, sleep disturbance and suicidal ideas. The patient is nervous/anxious. The patient is not hyperactive.    Medications: I have reviewed the patient's current medications.  Current Outpatient Medications  Medication Sig Dispense Refill   acetaminophen-codeine (TYLENOL #3) 300-30 MG tablet Take 1 tablet by mouth every 8 (eight) hours as needed.     ALPRAZolam (XANAX) 0.5 MG tablet Take 1 tablet by mouth every 8 hours as needed. 90 tablet 0   Cholecalciferol (VITAMIN D3) 25 MCG (1000 UT) CAPS Take by mouth.     Cyanocobalamin (VITAMIN B12) 1000 MCG TBCR 1 tablet  ISOtretinoin (ACCUTANE) 40 MG capsule Take 1 capsule (40 mg total) by mouth daily. 30 capsule 0   lamoTRIgine (LAMICTAL) 150 MG tablet Take 1 tablet by mouth every morning and take 2 tablets by mouth every evening. (Patient taking differently: 3 daily) 270 tablet 1   levonorgestrel (MIRENA) 20 MCG/24HR IUD 1 each by Intrauterine route once.     propranolol (INDERAL) 10 MG tablet Take 3 tablets (30 mg total) by mouth 2 (two) times daily. (Patient taking differently: Take 30 mg by mouth daily.) 540 tablet 0   No current facility-administered medications for this visit.    Medication Side Effects: None  Allergies:  Allergies  Allergen Reactions   Celexa [Citalopram]     Other reaction(s): tremor    Past Medical History:  Diagnosis Date   Other and unspecified disc disorder of lumbar region    torn lumbar disc?    Family History  Problem Relation Age of Onset   Diabetes Mother    Diabetes Father    Cancer Father        kidney   Cancer Paternal  Grandfather        PROSTATE    Social History   Socioeconomic History   Marital status: Single    Spouse name: Not on file   Number of children: Not on file   Years of education: Not on file   Highest education level: Not on file  Occupational History   Not on file  Tobacco Use   Smoking status: Every Day    Packs/day: 0.75    Types: Cigarettes   Smokeless tobacco: Never  Vaping Use   Vaping Use: Never used  Substance and Sexual Activity   Alcohol use: No   Drug use: No   Sexual activity: Yes    Birth control/protection: I.U.D., Condom    Comment: 1st intercourse- 24, partners- 5, mirena inserted 04-16-17  Other Topics Concern   Not on file  Social History Narrative   Not on file   Social Determinants of Health   Financial Resource Strain: Not on file  Food Insecurity: Not on file  Transportation Needs: Not on file  Physical Activity: Not on file  Stress: Not on file  Social Connections: Not on file  Intimate Partner Violence: Not on file    Past Medical History, Surgical history, Social history, and Family history were reviewed and updated as appropriate.   Please see review of systems for further details on the patient's review from today.   Objective:   Physical Exam:  There were no vitals taken for this visit.  Physical Exam Constitutional:      Appearance: She is obese.  Musculoskeletal:        General: No deformity.  Neurological:     Mental Status: She is alert.     Cranial Nerves: No dysarthria.     Motor: No weakness or tremor.     Gait: Gait normal.  Psychiatric:        Attention and Perception: Attention and perception normal.        Mood and Affect: Mood is depressed. Mood is not anxious.        Speech: Speech is not rapid and pressured, slurred or tangential.        Behavior: Behavior normal. Behavior is cooperative.        Thought Content: Thought content normal. Thought content is not delusional. Thought content does not include  suicidal ideation.        Cognition  and Memory: Cognition and memory normal. Cognition is not impaired. She does not exhibit impaired recent memory.        Judgment: Judgment normal.      Lab Review:     Component Value Date/Time   NA 139 04/10/2021 1324   K 4.0 04/10/2021 1324   CL 106 04/10/2021 1324   CO2 27 04/10/2021 1324   GLUCOSE 88 04/10/2021 1324   BUN 9 04/10/2021 1324   CREATININE 0.70 04/10/2021 1324   CALCIUM 9.1 04/10/2021 1324   PROT 6.4 04/10/2021 1324   ALBUMIN 4.2 08/18/2016 0838   AST 14 04/10/2021 1324   ALT 20 04/10/2021 1324   ALKPHOS 65 08/18/2016 0838   BILITOT 0.4 04/10/2021 1324       Component Value Date/Time   WBC 8.2 04/10/2021 1324   RBC 4.36 04/10/2021 1324   HGB 14.3 04/10/2021 1324   HCT 41.5 04/10/2021 1324   PLT 266 04/10/2021 1324   MCV 95.2 04/10/2021 1324   MCH 32.8 04/10/2021 1324   MCHC 34.5 04/10/2021 1324   RDW 12.0 04/10/2021 1324   LYMPHSABS 2,763 04/10/2021 1324   MONOABS 292 08/18/2016 0838   EOSABS 607 (H) 04/10/2021 1324   BASOSABS 74 04/10/2021 1324    No results found for: POCLITH, LITHIUM   No results found for: PHENYTOIN, PHENOBARB, VALPROATE, CBMZ   .res Assessment: Plan:    Bipolar II disorder (HCC)  Generalized anxiety disorder  Panic disorder with agoraphobia  PTSD (post-traumatic stress disorder)  Fatigue, unspecified type  Insomnia due to mental condition  Medsensitive.  Lindsey [name] has a long history of bipolar disorder.  Usually manic episodes are mild.  Her mother also has bipolar disorder.  She has failed numerous mood stabilizers as noted above.  She is now hypomanic with no particular trigger.  This may be associated with a tendency toward spring hypomania and bipolar patients. Chronic compliance problems with addition of meds.   Option Trileptal 150 mg HS. This is off label for bipolar mania or hypomania.  She is fairly sensitive to mood stabilizers historically.  She does not want weight gain.   That limits options given the failures noted above.  Discussed side effects in detail.  She agrees to this plan.   She feels self help has been beneficial sns she doesn't need more med.  Prefers sheild-shaped lamotrigine and oval alprazolam vs round of each in terms of effectivenss.  Asked her to get the generic manufacturer names for these and we can request them.  Disc differences with generics.  Sleep deprived during the week and too much caffeine.  Disc sleep hygiene.  Stop caffeine at 3 instead of 7 PM.  This may be the cause of the insomnia during the week.  B12 and folate levels normal.  The propranolol was very helpful for her anxiety.  It also controlled the palpitations. Try it before naps to see if it prevents NM. Consider doxazosin.  We discussed the short-term risks associated with benzodiazepines including sedation and increased fall risk among others.  Discussed long-term side effect risk including dependence, potential withdrawal symptoms, and the potential eventual dose-related risk of dementia.  Xanax helped a lot with normal and social anxiety. Continue Xanax and propranolol prn.  Patient needs to sleep in order to control the hypomania.  Limit Xanax DT fatigue.  Option Caplyta but likely need lower dose and those samples not available. Disc mechanism of action.  Supportive therapy on managing the recent stressors and disc recent  LOA from work for mental health reasons.  Was helpful.  Disc possible return to in office from work at home issues.  Be careful with caffeine bc anxiety. No med changes indicated  FU 4-6 mos.  Meredith Staggers, MD, DFAPA     Please see After Visit Summary for patient specific instructions.  Future Appointments  Date Time Provider Department Center  08/27/2021 11:45 AM Glyn Ade, PA-C [facility]  09/26/2021 11:15 AM Glyn Ade, PA-C [facility]  10/29/2021  3:00 PM Glyn Ade, PA-C [facility]  11/29/2021  2:30 PM  Genia Del, MD [facility] None    No orders of the defined types were placed in this encounter.      -------------------------------

## 2021-08-26 ENCOUNTER — Telehealth: Payer: Self-pay | Admitting: Psychiatry

## 2021-08-26 ENCOUNTER — Other Ambulatory Visit: Payer: Self-pay | Admitting: Psychiatry

## 2021-08-26 DIAGNOSIS — F4001 Agoraphobia with panic disorder: Secondary | ICD-10-CM

## 2021-08-26 DIAGNOSIS — F4310 Post-traumatic stress disorder, unspecified: Secondary | ICD-10-CM

## 2021-08-26 DIAGNOSIS — F3181 Bipolar II disorder: Secondary | ICD-10-CM

## 2021-08-26 MED ORDER — LAMOTRIGINE 150 MG PO TABS: 150.0000 mg | ORAL_TABLET | Freq: Three times a day (TID) | ORAL | 0 refills | Status: DC

## 2021-08-26 NOTE — Telephone Encounter (Signed)
Pt already has rx at [facility] but the problem is delayed shipping.She is going to try and find a local pharmacy that uses the manufacturer and call us back

## 2021-08-26 NOTE — Telephone Encounter (Signed)
Pt called back to say that the Lamotrigine made by Marlyce Huge is available at [facility] on [address]. If possible, please send in there. They had #[phone] left. [phone]

## 2021-08-26 NOTE — Telephone Encounter (Signed)
Next visit is 12/18/21. Lindsey [name] has been taking Lamotrigine through the [facility] manufacturer. (Its a little round pill). Lindsey [name] says this pill is not as effective. She had spoken to Dr. Jennelle Human about this before and told him that [place] has a little pill (a shield one) and manufacturer is [employer] and this pill works so much better and is a lot more effective than the [facility] pills. Could she see about getting the Lamotrigine thru [employer]? Her phone number is [phone].

## 2021-08-27 ENCOUNTER — Ambulatory Visit (INDEPENDENT_AMBULATORY_CARE_PROVIDER_SITE_OTHER): Payer: 59 | Admitting: Physician Assistant

## 2021-08-27 ENCOUNTER — Other Ambulatory Visit: Payer: Self-pay

## 2021-08-27 DIAGNOSIS — Z5181 Encounter for therapeutic drug level monitoring: Secondary | ICD-10-CM

## 2021-08-27 DIAGNOSIS — L700 Acne vulgaris: Secondary | ICD-10-CM | POA: Diagnosis not present

## 2021-08-27 MED ORDER — ISOTRETINOIN 40 MG PO CAPS: 40.0000 mg | ORAL_CAPSULE | Freq: Every day | ORAL | 0 refills | Status: DC

## 2021-08-28 LAB — PREGNANCY, URINE: Preg Test, Ur: NEGATIVE

## 2021-09-02 ENCOUNTER — Ambulatory Visit: Payer: 59 | Admitting: Obstetrics & Gynecology

## 2021-09-04 ENCOUNTER — Other Ambulatory Visit: Payer: Self-pay | Admitting: Psychiatry

## 2021-09-04 DIAGNOSIS — F3181 Bipolar II disorder: Secondary | ICD-10-CM

## 2021-09-15 ENCOUNTER — Encounter: Payer: Self-pay | Admitting: Physician Assistant

## 2021-09-15 NOTE — Progress Notes (Signed)
° °  Follow-Up Visit   Subjective  Lindsey [name] is a 46 y.o. female who presents for the following: Acne (Patient here today for 31 day). She is doing well and improving every month. She is happy with her results.    The following portions of the chart were reviewed this encounter and updated as appropriate:  Tobacco   Allergies   Meds   Problems   Med Hx   Surg Hx   Fam Hx       Objective  Well appearing patient in no apparent distress; mood and affect are within normal limits.  A full examination was performed including scalp, head, eyes, ears, nose, lips, neck, chest, axillae, abdomen, back, buttocks, bilateral upper extremities, bilateral lower extremities, hands, feet, fingers, toes, fingernails, and toenails. All findings within normal limits unless otherwise noted below.  Head - Anterior (Face) Face clear with less hyperpigmentation.    Assessment & Plan  Acne vulgaris Head - Anterior (Face)    Pregnancy, urine - Head - Anterior (Face)  ISOtretinoin (ACCUTANE) 40 MG capsule - Head - Anterior (Face) Take 1 capsule (40 mg total) by mouth daily.  Encounter for medication monitoring  Related Procedures Pregnancy, urine    I, Nickolai Rinks, PA-C, have reviewed all documentation's for this visit.  The documentation on 09/15/21 for the exam, diagnosis, procedures and orders are all accurate and complete.

## 2021-09-26 ENCOUNTER — Ambulatory Visit (INDEPENDENT_AMBULATORY_CARE_PROVIDER_SITE_OTHER): Payer: 59 | Admitting: Physician Assistant

## 2021-09-26 ENCOUNTER — Encounter: Payer: Self-pay | Admitting: Physician Assistant

## 2021-09-26 ENCOUNTER — Other Ambulatory Visit: Payer: Self-pay

## 2021-09-26 DIAGNOSIS — Z5181 Encounter for therapeutic drug level monitoring: Secondary | ICD-10-CM

## 2021-09-26 DIAGNOSIS — L309 Dermatitis, unspecified: Secondary | ICD-10-CM

## 2021-09-26 DIAGNOSIS — L700 Acne vulgaris: Secondary | ICD-10-CM

## 2021-09-26 MED ORDER — ISOTRETINOIN 40 MG PO CAPS: 40.0000 mg | ORAL_CAPSULE | Freq: Every day | ORAL | 0 refills | Status: AC

## 2021-09-26 MED ORDER — TRIAMCINOLONE ACETONIDE 0.1 % EX OINT: 1.0000 "application " | TOPICAL_OINTMENT | Freq: Two times a day (BID) | CUTANEOUS | 11 refills | Status: AC | PRN

## 2021-09-26 NOTE — Progress Notes (Signed)
? ?  Follow-Up Visit ?  ?Subjective  ?Lindsey [name] is a 46 y.o. female who presents for the following: Acne (31 days no side effects no break outs ). ? ? ?The following portions of the chart were reviewed this encounter and updated as appropriate:  Tobacco  Allergies  Meds  Problems  Med Hx  Surg Hx  Fam Hx   ?  ? ?Objective  ?Well appearing patient in no apparent distress; mood and affect are within normal limits. ? ?All skin waist up examined. ? ?Head - Anterior (Face) ?Completely clear ? ?Left Dorsal Hand, Right Dorsal Hand ?Ill-defined pink papules/plaques with scale-crust.  ? ? ?Assessment & Plan  ?Acne vulgaris ?Head - Anterior (Face) ? ?ISOtretinoin (ACCUTANE) 40 MG capsule - Head - Anterior (Face) ?Take 1 capsule (40 mg total) by mouth daily. ? ?Pregnancy, urine - Head - Anterior (Face) ? ?Encounter for therapeutic drug monitoring ? ?Related Procedures ?Pregnancy, urine ? ?Eczema, unspecified type ?Left Dorsal Hand; Right Dorsal Hand ? ?triamcinolone ointment (KENALOG) 0.1 % - Left Dorsal Hand, Right Dorsal Hand ?Apply 1 application. topically 2 (two) times daily as needed (Rash). ? ? ? ?I, Supriya Beaston, PA-C, have reviewed all documentation's for this visit.  The documentation on 09/26/21 for the exam, diagnosis, procedures and orders are all accurate and complete. ?

## 2021-09-27 LAB — PREGNANCY, URINE: Preg Test, Ur: NEGATIVE

## 2021-09-30 ENCOUNTER — Telehealth: Payer: Self-pay | Admitting: *Deleted

## 2021-09-30 NOTE — Telephone Encounter (Signed)
Ipledge updated.

## 2021-10-29 ENCOUNTER — Encounter: Payer: Self-pay | Admitting: Physician Assistant

## 2021-10-29 ENCOUNTER — Ambulatory Visit (INDEPENDENT_AMBULATORY_CARE_PROVIDER_SITE_OTHER): Payer: 59 | Admitting: Physician Assistant

## 2021-10-29 DIAGNOSIS — L700 Acne vulgaris: Secondary | ICD-10-CM

## 2021-10-29 DIAGNOSIS — Z5181 Encounter for therapeutic drug level monitoring: Secondary | ICD-10-CM | POA: Diagnosis not present

## 2021-10-29 MED ORDER — ISOTRETINOIN 40 MG PO CAPS: 40.0000 mg | ORAL_CAPSULE | Freq: Every day | ORAL | 0 refills | Status: DC

## 2021-10-30 LAB — PREGNANCY, URINE: Preg Test, Ur: NEGATIVE

## 2021-11-04 ENCOUNTER — Encounter: Payer: Self-pay | Admitting: Physician Assistant

## 2021-11-04 NOTE — Progress Notes (Signed)
? ?  Follow-Up Visit ?  ?Subjective  ?Lindsey [name] is a 46 y.o. female who presents for the following: No chief complaint on file.. ? ? ?The following portions of the chart were reviewed this encounter and updated as appropriate:  Tobacco  Allergies  Meds  Problems  Med Hx  Surg Hx  Fam Hx   ?  ? ?Objective  ?Well appearing patient in no apparent distress; mood and affect are within normal limits. ? ?A focused examination was performed including face. Relevant physical exam findings are noted in the Assessment and Plan. ? ?Head - Anterior (Face) ?Completely clear.  ? ? ?Assessment & Plan  ?Acne vulgaris ?Head - Anterior (Face) ? ?Pregnancy, urine - Head - Anterior (Face) ? ?ISOtretinoin (ACCUTANE) 40 MG capsule - Head - Anterior (Face) ?Take 1 capsule (40 mg total) by mouth daily. ? ?Encounter for therapeutic drug monitoring ? ?Related Procedures ?Pregnancy, urine ? ?Related Medications ?ISOtretinoin (ACCUTANE) 40 MG capsule ?Take 1 capsule (40 mg total) by mouth daily. ? ? ? ?I, Bennie Scaff, PA-C, have reviewed all documentation's for this visit.  The documentation on 11/04/21 for the exam, diagnosis, procedures and orders are all accurate and complete. ?

## 2021-11-28 ENCOUNTER — Ambulatory Visit (INDEPENDENT_AMBULATORY_CARE_PROVIDER_SITE_OTHER): Payer: 59 | Admitting: Physician Assistant

## 2021-11-28 ENCOUNTER — Encounter: Payer: Self-pay | Admitting: Physician Assistant

## 2021-11-28 DIAGNOSIS — L700 Acne vulgaris: Secondary | ICD-10-CM | POA: Diagnosis not present

## 2021-11-28 DIAGNOSIS — Z5181 Encounter for therapeutic drug level monitoring: Secondary | ICD-10-CM | POA: Diagnosis not present

## 2021-11-28 MED ORDER — ISOTRETINOIN 40 MG PO CAPS: 40.0000 mg | ORAL_CAPSULE | Freq: Every day | ORAL | 0 refills | Status: AC

## 2021-11-28 NOTE — Progress Notes (Signed)
? ?  Follow-Up Visit ?  ?Subjective  ?Lindsey [name] is a 46 y.o. female who presents for the following: Acne (Isotretinoin follow up.). No problems. Two month to finish her medication. ? ? ?The following portions of the chart were reviewed this encounter and updated as appropriate:  Tobacco  Allergies  Meds  Problems  Med Hx  Surg Hx  Fam Hx   ?  ? ?Objective  ?Well appearing patient in no apparent distress; mood and affect are within normal limits. ? ?A focused examination was performed including face. Relevant physical exam findings are noted in the Assessment and Plan. ? ?Head - Anterior (Face) ?Completely clear.  ? ? ?Assessment & Plan  ?Acne vulgaris ?Head - Anterior (Face) ? ?ISOtretinoin (ACCUTANE) 40 MG capsule - Head - Anterior (Face) ?Take 1 capsule (40 mg total) by mouth daily. ? ?Pregnancy, urine - Head - Anterior (Face) ? ?Encounter for therapeutic drug monitoring ? ?Related Medications ?ISOtretinoin (ACCUTANE) 40 MG capsule ?Take 1 capsule (40 mg total) by mouth daily. ? ? ? ?I, Janett Kamath, PA-C, have reviewed all documentation's for this visit.  The documentation on 11/28/21 for the exam, diagnosis, procedures and orders are all accurate and complete. ?

## 2021-11-29 ENCOUNTER — Ambulatory Visit: Payer: 59 | Admitting: Obstetrics & Gynecology

## 2021-11-29 LAB — PREGNANCY, URINE: Preg Test, Ur: NEGATIVE

## 2021-11-30 ENCOUNTER — Other Ambulatory Visit: Payer: Self-pay | Admitting: Psychiatry

## 2021-11-30 DIAGNOSIS — F4310 Post-traumatic stress disorder, unspecified: Secondary | ICD-10-CM

## 2021-11-30 DIAGNOSIS — F4001 Agoraphobia with panic disorder: Secondary | ICD-10-CM

## 2021-12-18 ENCOUNTER — Other Ambulatory Visit: Payer: Self-pay

## 2021-12-18 ENCOUNTER — Encounter: Payer: Self-pay | Admitting: Psychiatry

## 2021-12-18 ENCOUNTER — Ambulatory Visit (INDEPENDENT_AMBULATORY_CARE_PROVIDER_SITE_OTHER): Payer: 59 | Admitting: Psychiatry

## 2021-12-18 DIAGNOSIS — F3181 Bipolar II disorder: Secondary | ICD-10-CM

## 2021-12-18 DIAGNOSIS — F5105 Insomnia due to other mental disorder: Secondary | ICD-10-CM

## 2021-12-18 DIAGNOSIS — F411 Generalized anxiety disorder: Secondary | ICD-10-CM

## 2021-12-18 DIAGNOSIS — F4310 Post-traumatic stress disorder, unspecified: Secondary | ICD-10-CM

## 2021-12-18 DIAGNOSIS — F4001 Agoraphobia with panic disorder: Secondary | ICD-10-CM

## 2021-12-18 MED ORDER — LAMOTRIGINE 150 MG PO TABS: ORAL_TABLET | ORAL | 1 refills | Status: DC

## 2021-12-18 MED ORDER — ALPRAZOLAM 0.5 MG PO TABS: 0.5000 mg | ORAL_TABLET | Freq: Three times a day (TID) | ORAL | 3 refills | Status: DC | PRN

## 2021-12-18 MED ORDER — PROPRANOLOL HCL 10 MG PO TABS: 30.0000 mg | ORAL_TABLET | Freq: Two times a day (BID) | ORAL | 1 refills | Status: DC

## 2021-12-18 NOTE — Progress Notes (Signed)
Lindsey [name] [id number] 11-20-1975 46 y.o.   Subjective:   Patient ID:  Lindsey [name] is a 46 y.o. (DOB [birth year]) female.  Chief Complaint:  Chief Complaint  Patient presents with   Follow-up    Bipolar II disorder ([place])   Depression   Anxiety    Depression        Associated symptoms include no decreased concentration, no fatigue and no suicidal ideas.  Lindsey [name] presents to today for follow-up of mood and anxiety.  When seen April 27th, 2020.  She was hypomanic at the time and having insomnia.  We increased Xanax 1 mg nightly and she was encouraged to increase oxcarbazepine to 600 mg nightly to help with the hypomania.  She is fairly sensitive to mood stabilizers and has failed several.  She returned in June reporting hypomania resolved.  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  Finally ended the trauma bond and is better.  Trileptal and Xanax at night is better but only taking 1/2 of 300mg  Trileptal bc sleepiness from 300 mg hs.  She's taking Xanax 0.5 mg AM and HS.  Getting a lot done.  Problematic boss got fired for performance problems.  No meds were changed at the June visit.  seen October 2020 the following was noted:She is now hypomanic with no particular trigger.  This may be associated with a tendency toward spring hypomania and bipolar patients. Chronic compliance problems with addition of meds.  Rec restart Trileptal 150 mg HS. This is off label for bipolar mania or hypomania.  She is fairly sensitive to mood stabilizers historically.  She does not want weight gain.   November 17, 2019 appointment, the following is noted: Been working on self growth and taking advantages of resources on internet and self esteem.  Pleased with meds. Not taken Trileptal.   Wonders if she has borderline pd bc splitting and black and white thinking.  Mood swings can be rapid from 30 min to 3 days.  Also triggered by abandonment. Chronic struggles with  intrusive mother who she feels triggers her. Panic triggered only with one person.  Social anxiety is less. Working from home helps. Patient reports stable mood and denies depressed or irritable moods.    Patient denies difficulty with sleep initiation or maintenance. Denies appetite disturbance.  Patient reports that energy and motivation have been good.  Patient denies any difficulty with concentration.  Patient denies any suicidal ideation. Plan no med changes  03/15/20 appt. With the following noted:   Wrote her out of work for 3 weeks DT multiple stressors with "mental health overload" including father of child facing jail then mistake at work.  Intense sensitivity to failure.  Couldn't eat and lost 13# in 3 weeks.  Needed break from work to manage stress.  Break was very helpful at restoration. Now doing OK.   Working from home has been good for her.  May ask for excuse to work part of the time from home. Started B12 and D3 which helped energy.  Not overly depressed.  No mood swings.  Incredible fear of abandonment also flared up in rel with current BF. Patient reports stable mood and denies depressed or irritable moods.   Patient denies difficulty with sleep initiation or maintenance.  NM if naps.  Denies appetite disturbance.  Patient reports that energy and motivation have been good.  Patient denies any difficulty with concentration.  Patient denies any suicidal ideation. Plan:  No med changes  11/19/2020 appointment with the following noted: Doing OK.  Meds working fine.  Doing [place] spiritually. Concern is so fatigued.  Not sure the cause.  Wonders about vitamin deficiency.  Naps when she can.  Not like a depressed fatigue. Sleep is OK.  Average 5 hours nightly during the week.  Partly bc it's quiet. In this pattern for a month or so. Big project at work requiring all attention and effort during the day. 20% raise. Drinks caffeine  All day and has for years.  03/27/2021 appointment with the  following noted: Doing alright.  Still work stress.  Company bought out.  So expects to get laid off.   Takes Xanax 0.5 mg AM and then 0.25 prn about 2-3 times per month.  Reaches for propranolol first.  Inderall helped anxiety a whole lot. Stress M recently psychotic and hospitalized and has Poor coping and severe fear of abandoment.. Pt is inconsistent and maybe PMS related and may nap 2-3 hours.  May sleep to shut off the world for a little. Overall is doing OK and at baseline.  Has worked on Educational psychologist and has done a lot of Psychologist, educational.   Still on lamotrigine 150 AM and 300 HS. No SE. Plan: Be careful with caffeine bc anxiety. No med changes indicated  08/15/2021 appointment with the following noted: Situational depression.  Got a raise and not laid off.   Position with corporate team and permanent now.  Will be different after 18 years at [employer].   Still on lamotrigine 150 AM and 300 HS.. Stable depression and working on herself.  Getting better. Want to get stuff done around the house but hard to make herself do it. Sleep pattern terrible with naps and split schedule but getting 8 hours of sleep.  Likes staying up late to have quiet.   D will be 13 in May.  Hates [employer] and declining grades.    Needs transfer.   No concerns about meds. Bad panic in November with trigger of seeing someone with residual sx for a week.  Worst in a long time.  Was trauma trigger. Plan: No med changes  12/18/2021 appointment with the following noted: Doing good.  A lot of stress since here and managed well.  Worked on attachment issues in therapy.   Changed Xnanx to 0.5 mg 1/2 BID and it's helped to split it out.  Helped anxiety to do that. Taking Lindsey [name]'s father to court for support and custody. Lindsey [name] a lot of mental health problems including anxiety. Working on losing weight counting calories. Lost 18#. Work from home FT good. Patient reports stable mood and denies  depressed or irritable moods.   Patient denies difficulty with sleep initiation or maintenance. Denies appetite disturbance.  Patient reports that energy and motivation have been good.  Patient denies any difficulty with concentration.  Patient denies any suicidal ideation.   Xanax more calming than the others.  Lindsey [name] is 33 yo.  Her father is alcoholic.  Last hypomania was November 2018 and then again April 2018.  Past Psychiatric Medication Trials: Poor response SSRIs,  Effexor, Paxil, sertraline itchy, Wellbutrin, Serzone, Strattera nausea,  Vraylar, olanzapine sedation, Latuda fogginess, risperidone, Abilify, Trileptal 300 tired, lamotrigine,  lithium, propranolol,    Klonopin, Ativan, Xanax, buspirone nausea NAC,   Review of Systems:  Review of Systems  Constitutional:  Negative for fatigue.  Cardiovascular:  Negative for chest pain and palpitations.  Neurological:  Negative  for tremors.  Psychiatric/Behavioral:  Positive for dysphoric mood. Negative for agitation, behavioral problems, confusion, decreased concentration, hallucinations, self-injury, sleep disturbance and suicidal ideas. The patient is nervous/anxious. The patient is not hyperactive.    Medications: I have reviewed the patient's current medications.  Current Outpatient Medications  Medication Sig Dispense Refill   Cholecalciferol (VITAMIN D3) 25 MCG (1000 UT) CAPS Take by mouth.     Cyanocobalamin (VITAMIN B12) 1000 MCG TBCR 1 tablet     ISOtretinoin (ACCUTANE) 40 MG capsule Take 1 capsule (40 mg total) by mouth daily. 30 capsule 0   levonorgestrel (MIRENA) 20 MCG/24HR IUD 1 each by Intrauterine route once.     triamcinolone ointment (KENALOG) 0.1 % Apply 1 application. topically 2 (two) times daily as needed (Rash). 454 g 11   acetaminophen-codeine (TYLENOL #3) 300-30 MG tablet Take 1 tablet by mouth every 8 (eight) hours as needed.     ALPRAZolam (XANAX) 0.5 MG tablet Take 1 tablet (0.5 mg total) by mouth every 8  (eight) hours as needed. 90 tablet 3   lamoTRIgine (LAMICTAL) 150 MG tablet Take 1 tablet by mouth every morning and take 2 tablets by mouth every evening. 270 tablet 1   propranolol (INDERAL) 10 MG tablet Take 3 tablets (30 mg total) by mouth 2 (two) times daily. 540 tablet 1   No current facility-administered medications for this visit.    Medication Side Effects: None  Allergies:  Allergies  Allergen Reactions   Celexa [Citalopram]     Other reaction(s): tremor    Past Medical History:  Diagnosis Date   Other and unspecified disc disorder of lumbar region    torn lumbar disc?    Family History  Problem Relation Age of Onset   Diabetes Mother    Diabetes Father    Cancer Father        kidney   Cancer Paternal Grandfather        PROSTATE    Social History   Socioeconomic History   Marital status: Single    Spouse name: Not on file   Number of children: Not on file   Years of education: Not on file   Highest education level: Not on file  Occupational History   Not on file  Tobacco Use   Smoking status: Every Day    Packs/day: 0.75    Types: Cigarettes   Smokeless tobacco: Never  Vaping Use   Vaping Use: Never used  Substance and Sexual Activity   Alcohol use: No   Drug use: No   Sexual activity: Yes    Birth control/protection: I.U.D., Condom    Comment: 1st intercourse- 24, partners- 5, mirena inserted 04-16-17  Other Topics Concern   Not on file  Social History Narrative   Not on file   Social Determinants of Health   Financial Resource Strain: Not on file  Food Insecurity: Not on file  Transportation Needs: Not on file  Physical Activity: Not on file  Stress: Not on file  Social Connections: Not on file  Intimate Partner Violence: Not on file    Past Medical History, Surgical history, Social history, and Family history were reviewed and updated as appropriate.   Please see review of systems for further details on the patient's review from  today.   Objective:   Physical Exam:  There were no vitals taken for this visit.  Physical Exam Constitutional:      Appearance: She is obese.  Musculoskeletal:  General: No deformity.  Neurological:     Mental Status: She is alert.     Cranial Nerves: No dysarthria.     Motor: No weakness or tremor.     Gait: Gait normal.  Psychiatric:        Attention and Perception: Attention and perception normal.        Mood and Affect: Mood is not anxious or depressed.        Speech: Speech is not rapid and pressured, slurred or tangential.        Behavior: Behavior normal. Behavior is cooperative.        Thought Content: Thought content normal. Thought content is not delusional. Thought content does not include suicidal ideation.        Cognition and Memory: Cognition and memory normal. Cognition is not impaired. She does not exhibit impaired recent memory.        Judgment: Judgment normal.     Comments: Better mood stability      Lab Review:     Component Value Date/Time   NA 139 04/10/2021 1324   K 4.0 04/10/2021 1324   CL 106 04/10/2021 1324   CO2 27 04/10/2021 1324   GLUCOSE 88 04/10/2021 1324   BUN 9 04/10/2021 1324   CREATININE 0.70 04/10/2021 1324   CALCIUM 9.1 04/10/2021 1324   PROT 6.4 04/10/2021 1324   ALBUMIN 4.2 08/18/2016 0838   AST 14 04/10/2021 1324   ALT 20 04/10/2021 1324   ALKPHOS 65 08/18/2016 0838   BILITOT 0.4 04/10/2021 1324       Component Value Date/Time   WBC 8.2 04/10/2021 1324   RBC 4.36 04/10/2021 1324   HGB 14.3 04/10/2021 1324   HCT 41.5 04/10/2021 1324   PLT 266 04/10/2021 1324   MCV 95.2 04/10/2021 1324   MCH 32.8 04/10/2021 1324   MCHC 34.5 04/10/2021 1324   RDW 12.0 04/10/2021 1324   LYMPHSABS 2,763 04/10/2021 1324   MONOABS 292 08/18/2016 0838   EOSABS 607 (H) 04/10/2021 1324   BASOSABS 74 04/10/2021 1324    No results found for: POCLITH, LITHIUM   No results found for: PHENYTOIN, PHENOBARB, VALPROATE, CBMZ    .res Assessment: Plan:    Bipolar II disorder ([place]) - Plan: lamoTRIgine (LAMICTAL) 150 MG tablet  Generalized anxiety disorder - Plan: ALPRAZolam (XANAX) 0.5 MG tablet  Panic disorder with agoraphobia - Plan: propranolol (INDERAL) 10 MG tablet, ALPRAZolam (XANAX) 0.5 MG tablet  PTSD (post-traumatic stress disorder) - Plan: propranolol (INDERAL) 10 MG tablet  Insomnia due to mental condition  Medsensitive.  Shaliah has a long history of bipolar disorder.  Usually manic episodes are mild.  Her mother also has bipolar disorder.  She has failed numerous mood stabilizers as noted above.  She is now hypomanic with no particular trigger.  This may be associated with a tendency toward spring hypomania and bipolar patients. Chronic compliance problems with addition of meds.   Option Trileptal 150 mg HS. This is off label for bipolar mania or hypomania.  She is fairly sensitive to mood stabilizers historically.  She does not want weight gain.  That limits options given the failures noted above.  Discussed side effects in detail.  She agrees to this plan.   She feels self help has been beneficial sns she doesn't need more med.  Prefers sheild-shaped lamotrigine and oval alprazolam vs round of each in terms of effectivenss.  Asked her to get the generic manufacturer names for these and we can request  them.  Disc differences with generics.  Sleep deprived during the week and too much caffeine.  Disc sleep hygiene.  Stop caffeine at 3 instead of 7 PM.  This may be the cause of the insomnia during the week.  B12 and folate levels normal.  The propranolol was very helpful for her anxiety.  It also controlled the palpitations. Try it before naps to see if it prevents NM. Consider doxazosin.  We discussed the short-term risks associated with benzodiazepines including sedation and increased fall risk among others.  Discussed long-term side effect risk including dependence, potential withdrawal symptoms,  and the potential eventual dose-related risk of dementia.  Xanax helped a lot with normal and social anxiety. Continue Xanax and propranolol prn.  Patient needs to sleep in order to control the hypomania.  Limit Xanax DT fatigue.  Option Caplyta but likely need lower dose and those samples not available. Disc mechanism of action.  Supportive therapy on managing the recent stressors and disc recent LOA from work for mental health reasons.  Was helpful.  Disc possible return to in office from work at home issues. Disc getting D psych help.  Be careful with caffeine bc anxiety. No med changes indicated  FU 4-6 mos.  Lynder Parents, MD, DFAPA     Please see After Visit Summary for patient specific instructions.  Future Appointments  Date Time Provider [place]  12/31/2021 11:15 AM [place], Ronalee Red, PA-C [facility]    No orders of the defined types were placed in this encounter.      -------------------------------

## 2021-12-31 ENCOUNTER — Ambulatory Visit (INDEPENDENT_AMBULATORY_CARE_PROVIDER_SITE_OTHER): Payer: 59 | Admitting: Physician Assistant

## 2021-12-31 DIAGNOSIS — Z5181 Encounter for therapeutic drug level monitoring: Secondary | ICD-10-CM | POA: Diagnosis not present

## 2021-12-31 DIAGNOSIS — L700 Acne vulgaris: Secondary | ICD-10-CM | POA: Diagnosis not present

## 2021-12-31 MED ORDER — ISOTRETINOIN 40 MG PO CAPS: 40.0000 mg | ORAL_CAPSULE | Freq: Every day | ORAL | 0 refills | Status: AC

## 2022-01-01 LAB — PREGNANCY, URINE: Preg Test, Ur: NEGATIVE

## 2022-01-06 ENCOUNTER — Encounter: Payer: Self-pay | Admitting: Physician Assistant

## 2022-01-06 NOTE — Progress Notes (Signed)
   Follow-Up Visit   Subjective  Lindsey [name] is a 46 y.o. female who presents for the following: Acne (Patient here today for 31 day follow up). She is doing well.   The following portions of the chart were reviewed this encounter and updated as appropriate:  Tobacco  Allergies  Meds  Problems  Med Hx  Surg Hx  Fam Hx      Objective  Well appearing patient in no apparent distress; mood and affect are within normal limits.  A focused examination was performed including face. Relevant physical exam findings are noted in the Assessment and Plan.  Head - Anterior (Face) Face is completely clear.    Assessment & Plan  Acne vulgaris Head - Anterior (Face)  Pregnancy, urine - Head - Anterior (Face)  ISOtretinoin (ACCUTANE) 40 MG capsule - Head - Anterior (Face) Take 1 capsule (40 mg total) by mouth daily.  Encounter for therapeutic drug monitoring  Related Procedures Pregnancy, urine    I, Zevin Nevares, PA-C, have reviewed all documentation's for this visit.  The documentation on 01/06/22 for the exam, diagnosis, procedures and orders are all accurate and complete.

## 2022-02-05 ENCOUNTER — Other Ambulatory Visit: Payer: Self-pay | Admitting: Psychiatry

## 2022-02-05 ENCOUNTER — Telehealth: Payer: Self-pay | Admitting: Psychiatry

## 2022-02-05 DIAGNOSIS — F4001 Agoraphobia with panic disorder: Secondary | ICD-10-CM

## 2022-02-05 DIAGNOSIS — F4310 Post-traumatic stress disorder, unspecified: Secondary | ICD-10-CM

## 2022-02-05 DIAGNOSIS — F3181 Bipolar II disorder: Secondary | ICD-10-CM

## 2022-02-05 MED ORDER — CLONIDINE HCL 0.1 MG PO TABS: ORAL_TABLET | ORAL | 1 refills | Status: DC

## 2022-02-05 NOTE — Telephone Encounter (Signed)
LVM to rtc 

## 2022-02-05 NOTE — Telephone Encounter (Signed)
The next best option would be to use clonidine in place of the propranolol.  She can still use a lower dose of Xanax as needed and tolerated.  The clonidine is quick acting and will last about 10 hours.  Start with 1/2 tablet twice daily but that likely will be enough.  If it does not help right away then she can increase to 1 tablet twice daily.  She could use it on an as needed or on a regular basis.  The main side effect to watch out for is dizziness or lightheadedness.

## 2022-02-05 NOTE — Telephone Encounter (Signed)
Next visit is 04/21/22. Lindsey [name] states that she is having heightened anxiety and neither the Xanax nor Propanolol is helping her. Please call her at [phone].

## 2022-02-05 NOTE — Telephone Encounter (Signed)
Pt stated she has a tightness in her chest and rapid thoughts.She has a lot of personal things going on as well as work related stress.She took a whole xanax to see if it would help but it was overly sedating.She takes 3 propranolol during the day and added 2 more in the afternoon to see if it would help but it did not.Please advise

## 2022-02-06 NOTE — Telephone Encounter (Signed)
Pt informed

## 2022-02-28 ENCOUNTER — Other Ambulatory Visit: Payer: Self-pay | Admitting: Psychiatry

## 2022-02-28 DIAGNOSIS — F4001 Agoraphobia with panic disorder: Secondary | ICD-10-CM

## 2022-02-28 DIAGNOSIS — F4310 Post-traumatic stress disorder, unspecified: Secondary | ICD-10-CM

## 2022-02-28 DIAGNOSIS — F3181 Bipolar II disorder: Secondary | ICD-10-CM

## 2022-03-15 ENCOUNTER — Other Ambulatory Visit: Payer: Self-pay | Admitting: Psychiatry

## 2022-03-15 DIAGNOSIS — F4310 Post-traumatic stress disorder, unspecified: Secondary | ICD-10-CM

## 2022-03-15 DIAGNOSIS — F3181 Bipolar II disorder: Secondary | ICD-10-CM

## 2022-03-15 DIAGNOSIS — F4001 Agoraphobia with panic disorder: Secondary | ICD-10-CM

## 2022-04-21 ENCOUNTER — Encounter: Payer: Self-pay | Admitting: Psychiatry

## 2022-04-21 ENCOUNTER — Other Ambulatory Visit: Payer: Self-pay | Admitting: Psychiatry

## 2022-04-21 ENCOUNTER — Ambulatory Visit (INDEPENDENT_AMBULATORY_CARE_PROVIDER_SITE_OTHER): Payer: 59 | Admitting: Psychiatry

## 2022-04-21 DIAGNOSIS — F411 Generalized anxiety disorder: Secondary | ICD-10-CM

## 2022-04-21 DIAGNOSIS — F3181 Bipolar II disorder: Secondary | ICD-10-CM | POA: Diagnosis not present

## 2022-04-21 DIAGNOSIS — F4310 Post-traumatic stress disorder, unspecified: Secondary | ICD-10-CM

## 2022-04-21 DIAGNOSIS — F4001 Agoraphobia with panic disorder: Secondary | ICD-10-CM

## 2022-04-21 DIAGNOSIS — F5105 Insomnia due to other mental disorder: Secondary | ICD-10-CM

## 2022-04-21 DIAGNOSIS — R5383 Other fatigue: Secondary | ICD-10-CM

## 2022-04-21 MED ORDER — LAMOTRIGINE 150 MG PO TABS: ORAL_TABLET | ORAL | 1 refills | Status: DC

## 2022-04-21 MED ORDER — ALPRAZOLAM 0.5 MG PO TABS: 0.5000 mg | ORAL_TABLET | Freq: Three times a day (TID) | ORAL | 3 refills | Status: DC | PRN

## 2022-04-21 MED ORDER — PROPRANOLOL HCL 10 MG PO TABS: 10.0000 mg | ORAL_TABLET | Freq: Three times a day (TID) | ORAL | 1 refills | Status: DC | PRN

## 2022-04-21 NOTE — Progress Notes (Signed)
Lindsey [name] [id number] 21-Jul-1976 46 y.o.   Subjective:   Patient ID:  Lindsey [name] is a 46 y.o. (DOB Feb 12, 1976) female.  Chief Complaint:  Chief Complaint  Patient presents with   Follow-up    Bipolar II disorder (HCC)   Anxiety   Post-Traumatic Stress Disorder    Depression        Associated symptoms include no decreased concentration, no fatigue and no suicidal ideas.  Past medical history includes anxiety.   Anxiety Symptoms include nervous/anxious behavior. Patient reports no chest pain, confusion, decreased concentration, palpitations or suicidal ideas.      Lindsey [name] presents to today for follow-up of mood and anxiety.  When seen April 27th, 2020.  She was hypomanic at the time and having insomnia.  We increased Xanax 1 mg nightly and she was encouraged to increase oxcarbazepine to 600 mg nightly to help with the hypomania.  She is fairly sensitive to mood stabilizers and has failed several.  She returned in June reporting hypomania resolved.  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  Finally ended the trauma bond and is better.  Trileptal and Xanax at night is better but only taking 1/2 of 300mg  Trileptal bc sleepiness from 300 mg hs.  She's taking Xanax 0.5 mg AM and HS.  Getting a lot done.  Problematic boss got fired for performance problems.  No meds were changed at the June visit.  seen October 2020 the following was noted:She is now hypomanic with no particular trigger.  This may be associated with a tendency toward spring hypomania and bipolar patients. Chronic compliance problems with addition of meds.  Rec restart Trileptal 150 mg HS. This is off label for bipolar mania or hypomania.  She is fairly sensitive to mood stabilizers historically.  She does not want weight gain.   November 17, 2019 appointment, the following is noted: Been working on self growth and taking advantages of resources on internet and self esteem.  Pleased with  meds. Not taken Trileptal.   Wonders if she has borderline pd bc splitting and black and white thinking.  Mood swings can be rapid from 30 min to 3 days.  Also triggered by abandonment. Chronic struggles with intrusive mother who she feels triggers her. Panic triggered only with one person.  Social anxiety is less. Working from home helps. Patient reports stable mood and denies depressed or irritable moods.    Patient denies difficulty with sleep initiation or maintenance. Denies appetite disturbance.  Patient reports that energy and motivation have been good.  Patient denies any difficulty with concentration.  Patient denies any suicidal ideation. Plan no med changes  03/15/20 appt. With the following noted:   Wrote her out of work for 3 weeks DT multiple stressors with "mental health overload" including father of child facing jail then mistake at work.  Intense sensitivity to failure.  Couldn't eat and lost 13# in 3 weeks.  Needed break from work to manage stress.  Break was very helpful at restoration. Now doing OK.   Working from home has been good for her.  May ask for excuse to work part of the time from home. Started B12 and D3 which helped energy.  Not overly depressed.  No mood swings.  Incredible fear of abandonment also flared up in rel with current BF. Patient reports stable mood and denies depressed or irritable moods.   Patient denies difficulty with sleep initiation or maintenance.  NM if naps.  Denies appetite disturbance.  Patient reports that energy and motivation have been good.  Patient denies any difficulty with concentration.  Patient denies any suicidal ideation. Plan: No med changes  11/19/2020 appointment with the following noted: Doing OK.  Meds working fine.  Doing [place] spiritually. Concern is so fatigued.  Not sure the cause.  Wonders about vitamin deficiency.  Naps when she can.  Not like a depressed fatigue. Sleep is OK.  Average 5 hours nightly during the week.  Partly bc  it's quiet. In this pattern for a month or so. Big project at work requiring all attention and effort during the day. 20% raise. Drinks caffeine  All day and has for years.  03/27/2021 appointment with the following noted: Doing alright.  Still work stress.  Company bought out.  So expects to get laid off.   Takes Xanax 0.5 mg AM and then 0.25 prn about 2-3 times per month.  Reaches for propranolol first.  Inderall helped anxiety a whole lot. Stress M recently psychotic and hospitalized and has Poor coping and severe fear of abandoment.. Pt is inconsistent and maybe PMS related and may nap 2-3 hours.  May sleep to shut off the world for a little. Overall is doing OK and at baseline.  Has worked on Educational psychologist and has done a lot of Psychologist, educational.   Still on lamotrigine 150 AM and 300 HS. No SE. Plan: Be careful with caffeine bc anxiety. No med changes indicated  08/15/2021 appointment with the following noted: Situational depression.  Got a raise and not laid off.   Position with corporate team and permanent now.  Will be different after 18 years at [employer].   Still on lamotrigine 150 AM and 300 HS.. Stable depression and working on herself.  Getting better. Want to get stuff done around the house but hard to make herself do it. Sleep pattern terrible with naps and split schedule but getting 8 hours of sleep.  Likes staying up late to have quiet.   D will be 13 in May.  Hates [employer] and declining grades.    Needs transfer.   No concerns about meds. Bad panic in November with trigger of seeing someone with residual sx for a week.  Worst in a long time.  Was trauma trigger. Plan: No med changes  12/18/2021 appointment with the following noted: Doing good.  A lot of stress since here and managed well.  Worked on attachment issues in therapy.   Changed Xnanx to 0.5 mg 1/2 BID and it's helped to split it out.  Helped anxiety to do that. Taking Lindsey [name]'s father  to court for support and custody. Lindsey [name] a lot of mental health problems including anxiety. Working on losing weight counting calories. Lost 18#. Work from home FT good. Patient reports stable mood and denies depressed or irritable moods.   Patient denies difficulty with sleep initiation or maintenance. Denies appetite disturbance.  Patient reports that energy and motivation have been good.  Patient denies any difficulty with concentration.  Patient denies any suicidal ideation.  04/21/22 appt noted" Summer stress with anxierty overwhelmed.  Not managed with Xanax and propranolol.  RX clonidine off label.  Talked with friend and never tried it bc it got better. Anxity pretty good now.  Was taking propranolol 30 mg daily and now 20 mg daily. Some cycling of fatigue and napping.  A little irritability.  No mania. Tolerating meds. Xanax 0.25 mg AM.  Only. Sleep  schedule not always regular. Positives work raise.   Tori into charter school and pleased with change.  Xanax more calming than the others.  Lindsey [name] is 77 yo.  Her father is alcoholic.  Last hypomania was November 2018 and then again April 2018.  Past Psychiatric Medication Trials: Poor response SSRIs,  Effexor, Paxil, sertraline itchy, Wellbutrin, Serzone, Strattera nausea,  Vraylar, olanzapine sedation, Latuda fogginess, risperidone, Abilify, Trileptal 300 tired, lamotrigine,  lithium, propranolol,    Klonopin, Ativan, Xanax, buspirone nausea NAC,   Review of Systems:  Review of Systems  Constitutional:  Negative for fatigue.  Cardiovascular:  Negative for chest pain and palpitations.  Neurological:  Negative for tremors.  Psychiatric/Behavioral:  Negative for agitation, behavioral problems, confusion, decreased concentration, dysphoric mood, hallucinations, self-injury, sleep disturbance and suicidal ideas. The patient is nervous/anxious. The patient is not hyperactive.     Medications: I have reviewed the patient's current  medications.  Current Outpatient Medications  Medication Sig Dispense Refill   acetaminophen-codeine (TYLENOL #3) 300-30 MG tablet Take 1 tablet by mouth every 8 (eight) hours as needed.     Cholecalciferol (VITAMIN D3) 25 MCG (1000 UT) CAPS Take by mouth.     Cyanocobalamin (VITAMIN B12) 1000 MCG TBCR 1 tablet     levonorgestrel (MIRENA) 20 MCG/24HR IUD 1 each by Intrauterine route once.     triamcinolone ointment (KENALOG) 0.1 % Apply 1 application. topically 2 (two) times daily as needed (Rash). 454 g 11   ALPRAZolam (XANAX) 0.5 MG tablet Take 1 tablet (0.5 mg total) by mouth every 8 (eight) hours as needed. 90 tablet 3   lamoTRIgine (LAMICTAL) 150 MG tablet Take 1 tablet by mouth every morning and take 2 tablets by mouth every evening. 270 tablet 1   propranolol (INDERAL) 10 MG tablet Take 1 tablet (10 mg total) by mouth 3 (three) times daily as needed. 270 tablet 1   No current facility-administered medications for this visit.    Medication Side Effects: None  Allergies:  Allergies  Allergen Reactions   Celexa [Citalopram]     Other reaction(s): tremor    Past Medical History:  Diagnosis Date   Other and unspecified disc disorder of lumbar region    torn lumbar disc?    Family History  Problem Relation Age of Onset   Diabetes Mother    Diabetes Father    Cancer Father        kidney   Cancer Paternal Grandfather        PROSTATE    Social History   Socioeconomic History   Marital status: Single    Spouse name: Not on file   Number of children: Not on file   Years of education: Not on file   Highest education level: Not on file  Occupational History   Not on file  Tobacco Use   Smoking status: Every Day    Packs/day: 0.75    Types: Cigarettes   Smokeless tobacco: Never  Vaping Use   Vaping Use: Never used  Substance and Sexual Activity   Alcohol use: No   Drug use: No   Sexual activity: Yes    Birth control/protection: I.U.D., Condom    Comment: 1st  intercourse- 24, partners- 5, mirena inserted 04-16-17  Other Topics Concern   Not on file  Social History Narrative   Not on file   Social Determinants of Health   Financial Resource Strain: Not on file  Food Insecurity: Not on file  Transportation Needs: Not on file  Physical Activity: Not on file  Stress: Not on file  Social Connections: Not on file  Intimate Partner Violence: Not on file    Past Medical History, Surgical history, Social history, and Family history were reviewed and updated as appropriate.   Please see review of systems for further details on the patient's review from today.   Objective:   Physical Exam:  There were no vitals taken for this visit.  Physical Exam Constitutional:      Appearance: She is obese.  Musculoskeletal:        General: No deformity.  Neurological:     Mental Status: She is alert.     Cranial Nerves: No dysarthria.     Motor: No weakness or tremor.     Gait: Gait normal.  Psychiatric:        Attention and Perception: Attention and perception normal.        Mood and Affect: Mood is not anxious or depressed.        Speech: Speech is not rapid and pressured, slurred or tangential.        Behavior: Behavior normal. Behavior is cooperative.        Thought Content: Thought content normal. Thought content is not delusional. Thought content does not include suicidal ideation.        Cognition and Memory: Cognition and memory normal. Cognition is not impaired. She does not exhibit impaired recent memory.        Judgment: Judgment normal.     Comments: Good mood stability overall       Lab Review:     Component Value Date/Time   NA 139 04/10/2021 1324   K 4.0 04/10/2021 1324   CL 106 04/10/2021 1324   CO2 27 04/10/2021 1324   GLUCOSE 88 04/10/2021 1324   BUN 9 04/10/2021 1324   CREATININE 0.70 04/10/2021 1324   CALCIUM 9.1 04/10/2021 1324   PROT 6.4 04/10/2021 1324   ALBUMIN 4.2 08/18/2016 0838   AST 14 04/10/2021 1324    ALT 20 04/10/2021 1324   ALKPHOS 65 08/18/2016 0838   BILITOT 0.4 04/10/2021 1324       Component Value Date/Time   WBC 8.2 04/10/2021 1324   RBC 4.36 04/10/2021 1324   HGB 14.3 04/10/2021 1324   HCT 41.5 04/10/2021 1324   PLT 266 04/10/2021 1324   MCV 95.2 04/10/2021 1324   MCH 32.8 04/10/2021 1324   MCHC 34.5 04/10/2021 1324   RDW 12.0 04/10/2021 1324   LYMPHSABS 2,763 04/10/2021 1324   MONOABS 292 08/18/2016 0838   EOSABS 607 (H) 04/10/2021 1324   BASOSABS 74 04/10/2021 1324    No results found for: "POCLITH", "LITHIUM"   No results found for: "PHENYTOIN", "PHENOBARB", "VALPROATE", "CBMZ"   .res Assessment: Plan:    Bipolar II disorder (HCC) - Plan: lamoTRIgine (LAMICTAL) 150 MG tablet  Panic disorder with agoraphobia - Plan: ALPRAZolam (XANAX) 0.5 MG tablet, propranolol (INDERAL) 10 MG tablet  PTSD (post-traumatic stress disorder) - Plan: propranolol (INDERAL) 10 MG tablet  Generalized anxiety disorder - Plan: ALPRAZolam (XANAX) 0.5 MG tablet, propranolol (INDERAL) 10 MG tablet  Fatigue, unspecified type  Insomnia due to mental condition  Medsensitive.  Lindsey [name] has a long history of bipolar disorder.  Usually manic episodes are mild.  Her mother also has bipolar disorder.  She has failed numerous mood stabilizers as noted above.  She is now hypomanic with no particular trigger.  This may be associated with a tendency toward spring hypomania and  bipolar patients. Chronic compliance problems with addition of meds.   Option Trileptal 150 mg HS. This is off label for bipolar mania or hypomania.  She is fairly sensitive to mood stabilizers historically.  She does not want weight gain.  That limits options given the failures noted above.  Discussed side effects in detail.  She agrees to this plan.   She feels self help has been beneficial sns she doesn't need more med.  Prefers sheild-shaped lamotrigine and oval alprazolam vs round of each in terms of effectivenss.  Asked  her to get the generic manufacturer names for these and we can request them.  Disc differences with generics.  Sleep deprived during the week and too much caffeine.  Disc sleep hygiene.  Stop caffeine at 3 instead of 7 PM.  This may be the cause of the insomnia during the week.  B12 and folate levels normal.  The propranolol was very helpful for her anxiety.  It also controlled the palpitations. Try it before naps to see if it prevents NM. Consider doxazosin.  We discussed the short-term risks associated with benzodiazepines including sedation and increased fall risk among others.  Discussed long-term side effect risk including dependence, potential withdrawal symptoms, and the potential eventual dose-related risk of dementia.  Xanax helped a lot with normal and social anxiety. Continue Xanax and propranolol prn.  Patient needs to sleep in order to control the hypomania.  Limit Xanax DT fatigue.  Option Caplyta but likely need lower dose and those samples not available. Disc mechanism of action.  Supportive therapy on managing the recent stressors and disc recent LOA from work for mental health reasons.  Was helpful.  Disc possible return to in office from work at home issues. Disc getting D psych help.  Be careful with caffeine bc anxiety. No med changes indicated, option clonidine if needed.  FU 4-6 mos.  Lindsey Staggersarey Cottle, MD, DFAPA     Please see After Visit Summary for patient specific instructions.  No future appointments.   No orders of the defined types were placed in this encounter.      -------------------------------

## 2022-05-13 ENCOUNTER — Ambulatory Visit: Payer: 59 | Admitting: Physician Assistant

## 2022-08-26 ENCOUNTER — Ambulatory Visit (INDEPENDENT_AMBULATORY_CARE_PROVIDER_SITE_OTHER): Payer: 59 | Admitting: Psychiatry

## 2022-08-26 ENCOUNTER — Encounter: Payer: Self-pay | Admitting: Psychiatry

## 2022-08-26 DIAGNOSIS — F4310 Post-traumatic stress disorder, unspecified: Secondary | ICD-10-CM | POA: Diagnosis not present

## 2022-08-26 DIAGNOSIS — F5105 Insomnia due to other mental disorder: Secondary | ICD-10-CM

## 2022-08-26 DIAGNOSIS — F411 Generalized anxiety disorder: Secondary | ICD-10-CM

## 2022-08-26 DIAGNOSIS — F4001 Agoraphobia with panic disorder: Secondary | ICD-10-CM | POA: Diagnosis not present

## 2022-08-26 DIAGNOSIS — F3181 Bipolar II disorder: Secondary | ICD-10-CM | POA: Diagnosis not present

## 2022-08-26 DIAGNOSIS — F17210 Nicotine dependence, cigarettes, uncomplicated: Secondary | ICD-10-CM

## 2022-08-26 MED ORDER — BUPROPION HCL ER (XL) 150 MG PO TB24: ORAL_TABLET | ORAL | 0 refills | Status: DC

## 2022-08-26 NOTE — Patient Instructions (Signed)
Buproprion XL 150 mg , 1 in the am for 3 days, then 2 in the am.

## 2022-08-26 NOTE — Progress Notes (Signed)
Lindsey [name] [id number] 05/16/1976 47 y.o.   Subjective:   Patient ID:  Lindsey [name] is a 47 y.o. (DOB [birth year]) female.  Chief Complaint:  Chief Complaint  Patient presents with   Follow-up    Bipolar II disorder (HCC)   Anxiety   Post-Traumatic Stress Disorder   Depression    Depression        Associated symptoms include no decreased concentration, no fatigue and no suicidal ideas.  Past medical history includes anxiety.   Anxiety Symptoms include nervous/anxious behavior. Patient reports no chest pain, confusion, decreased concentration, palpitations or suicidal ideas.      Lindsey [name] presents to today for follow-up of mood and anxiety.  When seen April 27th, 2020.  She was hypomanic at the time and having insomnia.  We increased Xanax 1 mg nightly and she was encouraged to increase oxcarbazepine to 600 mg nightly to help with the hypomania.  She is fairly sensitive to mood stabilizers and has failed several.  She returned in June reporting hypomania resolved.  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  Finally ended the trauma bond and is better.  Trileptal and Xanax at night is better but only taking 1/2 of 300mg  Trileptal bc sleepiness from 300 mg hs.  She's taking Xanax 0.5 mg AM and HS.  Getting a lot done.  Problematic boss got fired for performance problems.  No meds were changed at the June visit.  seen October 2020 the following was noted:She is now hypomanic with no particular trigger.  This may be associated with a tendency toward spring hypomania and bipolar patients. Chronic compliance problems with addition of meds.  Rec restart Trileptal 150 mg HS. This is off label for bipolar mania or hypomania.  She is fairly sensitive to mood stabilizers historically.  She does not want weight gain.   November 17, 2019 appointment, the following is noted: Been working on self growth and taking advantages of resources on internet and self esteem.   Pleased with meds. Not taken Trileptal.   Wonders if she has borderline pd bc splitting and black and white thinking.  Mood swings can be rapid from 30 min to 3 days.  Also triggered by abandonment. Chronic struggles with intrusive mother who she feels triggers her. Panic triggered only with one person.  Social anxiety is less. Working from home helps. Patient reports stable mood and denies depressed or irritable moods.    Patient denies difficulty with sleep initiation or maintenance. Denies appetite disturbance.  Patient reports that energy and motivation have been good.  Patient denies any difficulty with concentration.  Patient denies any suicidal ideation. Plan no med changes  03/15/20 appt. With the following noted:   Wrote her out of work for 3 weeks DT multiple stressors with "mental health overload" including father of child facing jail then mistake at work.  Intense sensitivity to failure.  Couldn't eat and lost 13# in 3 weeks.  Needed break from work to manage stress.  Break was very helpful at restoration. Now doing OK.   Working from home has been good for her.  May ask for excuse to work part of the time from home. Started B12 and D3 which helped energy.  Not overly depressed.  No mood swings.  Incredible fear of abandonment also flared up in rel with current BF. Patient reports stable mood and denies depressed or irritable moods.   Patient denies difficulty with sleep initiation or maintenance.  NM if naps.  Denies appetite disturbance.  Patient reports that energy and motivation have been good.  Patient denies any difficulty with concentration.  Patient denies any suicidal ideation. Plan: No med changes  11/19/2020 appointment with the following noted: Doing OK.  Meds working fine.  Doing [place] spiritually. Concern is so fatigued.  Not sure the cause.  Wonders about vitamin deficiency.  Naps when she can.  Not like a depressed fatigue. Sleep is OK.  Average 5 hours nightly during the  week.  Partly bc it's quiet. In this pattern for a month or so. Big project at work requiring all attention and effort during the day. 20% raise. Drinks caffeine  All day and has for years.  03/27/2021 appointment with the following noted: Doing alright.  Still work stress.  Company bought out.  So expects to get laid off.   Takes Xanax 0.5 mg AM and then 0.25 prn about 2-3 times per month.  Reaches for propranolol first.  Inderall helped anxiety a whole lot. Stress M recently psychotic and hospitalized and has Poor coping and severe fear of abandoment.. Pt is inconsistent and maybe PMS related and may nap 2-3 hours.  May sleep to shut off the world for a little. Overall is doing OK and at baseline.  Has worked on Educational psychologist and has done a lot of Psychologist, educational.   Still on lamotrigine 150 AM and 300 HS. No SE. Plan: Be careful with caffeine bc anxiety. No med changes indicated  08/15/2021 appointment with the following noted: Situational depression.  Got a raise and not laid off.   Position with corporate team and permanent now.  Will be different after 18 years at [employer].   Still on lamotrigine 150 AM and 300 HS.. Stable depression and working on herself.  Getting better. Want to get stuff done around the house but hard to make herself do it. Sleep pattern terrible with naps and split schedule but getting 8 hours of sleep.  Likes staying up late to have quiet.   D will be 13 in May.  Hates [employer] and declining grades.    Needs transfer.   No concerns about meds. Bad panic in November with trigger of seeing someone with residual sx for a week.  Worst in a long time.  Was trauma trigger. Plan: No med changes  12/18/2021 appointment with the following noted: Doing good.  A lot of stress since here and managed well.  Worked on attachment issues in therapy.   Changed Xnanx to 0.5 mg 1/2 BID and it's helped to split it out.  Helped anxiety to do  that. Taking [name]'s father to court for support and custody. [name] a lot of mental health problems including anxiety. Working on losing weight counting calories. Lost 18#. Work from home FT good. Patient reports stable mood and denies depressed or irritable moods.   Patient denies difficulty with sleep initiation or maintenance. Denies appetite disturbance.  Patient reports that energy and motivation have been good.  Patient denies any difficulty with concentration.  Patient denies any suicidal ideation.  04/21/22 appt noted" Summer stress with anxierty overwhelmed.  Not managed with Xanax and propranolol.  RX clonidine off label.  Talked with friend and never tried it bc it got better. Anxity pretty good now.  Was taking propranolol 30 mg daily and now 20 mg daily. Some cycling of fatigue and napping.  A little irritability.  No mania. Tolerating meds. Xanax 0.25 mg  AM.  Only. Sleep schedule not always regular. Positives work raise.   Tori into charter school and pleased with change.  08/26/22 appt noted: Something is wrong. Mid Dec had to take 2 days off work bc depressed and burned out.  Cannot seem to pull up out of it.  Not sad but executive function freeze with amotivation.  Trouble with focus.  Can't pull herself out of it.  Enjoyment 4/10.  Affecting work.  Not much anxiety.  Sleep pattern erratic with average 5 hours and hard to make herself go to bed bc night is her free time.  Normal 7-8 hours.  Some naps.   Xanax more calming than the others.  Lindsey [name] is 73 yo.  Her father is alcoholic.  Last hypomania was November 2018 and then again April 2018.  Past Psychiatric Medication Trials: Poor response SSRIs,  Effexor, Paxil, sertraline itchy, Wellbutrin, Serzone, Strattera nausea,  Vraylar, olanzapine sedation, Latuda fogginess, risperidone, Abilify, Trileptal 300 tired, lamotrigine,  lithium, propranolol,    Klonopin, Ativan, Xanax, buspirone nausea NAC,   Review of Systems:   Review of Systems  Constitutional:  Negative for fatigue.  Cardiovascular:  Negative for chest pain and palpitations.  Neurological:  Negative for tremors.  Psychiatric/Behavioral:  Positive for dysphoric mood. Negative for agitation, behavioral problems, confusion, decreased concentration, hallucinations, self-injury, sleep disturbance and suicidal ideas. The patient is nervous/anxious. The patient is not hyperactive.     Medications: I have reviewed the patient's current medications.  Current Outpatient Medications  Medication Sig Dispense Refill   acetaminophen-codeine (TYLENOL #3) 300-30 MG tablet Take 1 tablet by mouth every 8 (eight) hours as needed.     ALPRAZolam (XANAX) 0.5 MG tablet Take 1 tablet (0.5 mg total) by mouth every 8 (eight) hours as needed. 90 tablet 3   Cholecalciferol (VITAMIN D3) 25 MCG (1000 UT) CAPS Take by mouth.     Cyanocobalamin (VITAMIN B12) 1000 MCG TBCR 1 tablet     lamoTRIgine (LAMICTAL) 150 MG tablet Take 1 tablet by mouth every morning and take 2 tablets by mouth every evening. 270 tablet 1   levonorgestrel (MIRENA) 20 MCG/24HR IUD 1 each by Intrauterine route once.     propranolol (INDERAL) 10 MG tablet Take 1 tablet (10 mg total) by mouth 3 (three) times daily as needed. (Patient taking differently: Take 10 mg by mouth 3 (three) times daily as needed. PRN) 270 tablet 1   triamcinolone ointment (KENALOG) 0.1 % Apply 1 application. topically 2 (two) times daily as needed (Rash). 454 g 11   buPROPion (WELLBUTRIN XL) 150 MG 24 hr tablet 1 in the AM for 5 days then 2 in the AM 30 tablet 0   No current facility-administered medications for this visit.    Medication Side Effects: None  Allergies:  Allergies  Allergen Reactions   Celexa [Citalopram]     Other reaction(s): tremor    Past Medical History:  Diagnosis Date   Other and unspecified disc disorder of lumbar region    torn lumbar disc?    Family History  Problem Relation Age of Onset    Diabetes Mother    Diabetes Father    Cancer Father        kidney   Cancer Paternal Grandfather        PROSTATE    Social History   Socioeconomic History   Marital status: Single    Spouse name: Not on file   Number of children: Not on file   Years of  education: Not on file   Highest education level: Not on file  Occupational History   Not on file  Tobacco Use   Smoking status: Every Day    Packs/day: 0.75    Types: Cigarettes   Smokeless tobacco: Never  Vaping Use   Vaping Use: Never used  Substance and Sexual Activity   Alcohol use: No   Drug use: No   Sexual activity: Yes    Birth control/protection: I.U.D., Condom    Comment: 1st intercourse- 24, partners- 5, mirena inserted 04-16-17  Other Topics Concern   Not on file  Social History Narrative   Not on file   Social Determinants of Health   Financial Resource Strain: Not on file  Food Insecurity: Not on file  Transportation Needs: Not on file  Physical Activity: Not on file  Stress: Not on file  Social Connections: Not on file  Intimate Partner Violence: Not on file    Past Medical History, Surgical history, Social history, and Family history were reviewed and updated as appropriate.   Please see review of systems for further details on the patient's review from today.   Objective:   Physical Exam:  There were no vitals taken for this visit.  Physical Exam Constitutional:      Appearance: She is obese.  Musculoskeletal:        General: No deformity.  Neurological:     Mental Status: She is alert.     Cranial Nerves: No dysarthria.     Motor: No weakness or tremor.     Gait: Gait normal.  Psychiatric:        Attention and Perception: Attention and perception normal.        Mood and Affect: Mood is not anxious.        Speech: Speech is not rapid and pressured, slurred or tangential.        Behavior: Behavior normal. Behavior is cooperative.        Thought Content: Thought content normal.  Thought content is not delusional. Thought content does not include suicidal ideation.        Cognition and Memory: Cognition and memory normal. Cognition is not impaired. She does not exhibit impaired recent memory.        Judgment: Judgment normal.     Comments: Good mood stability overall But blah and flat       Lab Review:     Component Value Date/Time   NA 139 04/10/2021 1324   K 4.0 04/10/2021 1324   CL 106 04/10/2021 1324   CO2 27 04/10/2021 1324   GLUCOSE 88 04/10/2021 1324   BUN 9 04/10/2021 1324   CREATININE 0.70 04/10/2021 1324   CALCIUM 9.1 04/10/2021 1324   PROT 6.4 04/10/2021 1324   ALBUMIN 4.2 08/18/2016 0838   AST 14 04/10/2021 1324   ALT 20 04/10/2021 1324   ALKPHOS 65 08/18/2016 0838   BILITOT 0.4 04/10/2021 1324       Component Value Date/Time   WBC 8.2 04/10/2021 1324   RBC 4.36 04/10/2021 1324   HGB 14.3 04/10/2021 1324   HCT 41.5 04/10/2021 1324   PLT 266 04/10/2021 1324   MCV 95.2 04/10/2021 1324   MCH 32.8 04/10/2021 1324   MCHC 34.5 04/10/2021 1324   RDW 12.0 04/10/2021 1324   LYMPHSABS 2,763 04/10/2021 1324   MONOABS 292 08/18/2016 0838   EOSABS 607 (H) 04/10/2021 1324   BASOSABS 74 04/10/2021 1324    No results found for: "POCLITH", "LITHIUM"  No results found for: "PHENYTOIN", "PHENOBARB", "VALPROATE", "CBMZ"   .res Assessment: Plan:    Bipolar II disorder ([place]) - Plan: buPROPion (WELLBUTRIN XL) 150 MG 24 hr tablet  Panic disorder with agoraphobia  PTSD (post-traumatic stress disorder)  Generalized anxiety disorder  Insomnia due to mental condition  Cigarette nicotine dependence without complication - Plan: buPROPion (WELLBUTRIN XL) 150 MG 24 hr tablet  Medsensitive.  Lindsey [name] has a long history of bipolar disorder.  Usually manic episodes are mild.  Her mother also has bipolar disorder.  She has failed numerous mood stabilizers as noted above.  She is now hypomanic with no particular trigger.  This may be associated with a  tendency toward spring hypomania and bipolar patients. Chronic compliance problems with addition of meds.   Option Trileptal 150 mg HS. This is off label for bipolar mania or hypomania.  She is fairly sensitive to mood stabilizers historically.  She does not want weight gain.  That limits options given the failures noted above.  Discussed side effects in detail.  She agrees to this plan.   She feels self help has been beneficial sns she doesn't need more med.  Prefers sheild-shaped lamotrigine and oval alprazolam vs round of each in terms of effectivenss.  Asked her to get the generic manufacturer names for these and we can request them.  Disc differences with generics.  Sleep deprived during the week and too much caffeine.  Disc sleep hygiene.  Stop caffeine at 3 instead of 7 PM.  This may be the cause of the insomnia during the week.  B12 and folate levels normal.  The propranolol was very helpful for her anxiety.  It also controlled the palpitations. Try it before naps to see if it prevents NM. Consider doxazosin.  We discussed the short-term risks associated with benzodiazepines including sedation and increased fall risk among others.  Discussed long-term side effect risk including dependence, potential withdrawal symptoms, and the potential eventual dose-related risk of dementia.  Xanax helped a lot with normal and social anxiety. Continue Xanax and propranolol prn.  Patient needs to sleep in order to control the hypomania.  Limit Xanax DT fatigue.  Option Caplyta but likely need lower dose and those samples not available. Disc mechanism of action.  Supportive therapy on managing the recent stressors and disc recent LOA from work for mental health reasons.  Was helpful.  Disc possible return to in office from work at home issues. Disc getting D psych help.  Get more sleep  Wellbutrin XL 150 AM, then 300 mg AM To help energy mood and can help stop smoking. Backup plan modafinil  FU  2  mos.  Lynder Parents, MD, DFAPA     Please see After Visit Summary for patient specific instructions.  No future appointments.   No orders of the defined types were placed in this encounter.      -------------------------------

## 2022-09-12 ENCOUNTER — Telehealth: Payer: Self-pay | Admitting: Psychiatry

## 2022-09-12 DIAGNOSIS — F17210 Nicotine dependence, cigarettes, uncomplicated: Secondary | ICD-10-CM

## 2022-09-12 DIAGNOSIS — F3181 Bipolar II disorder: Secondary | ICD-10-CM

## 2022-09-12 NOTE — Telephone Encounter (Signed)
Pt lvm that she was only given 18 pills That was all they had in stock

## 2022-09-16 MED ORDER — BUPROPION HCL ER (XL) 300 MG PO TB24: 300.0000 mg | ORAL_TABLET | Freq: Every day | ORAL | 0 refills | Status: DC

## 2022-09-16 NOTE — Telephone Encounter (Signed)
No medication was mentioned. Called patient and it is the bupropion. Will send in Rx for 300 mg tablets.

## 2022-09-16 NOTE — Telephone Encounter (Signed)
Sent, patient notified.

## 2022-09-16 NOTE — Telephone Encounter (Signed)
Pt called back at 10/08a.  She said today it was the way the script was written versus how she was suppose to take it that cause her to only get 18 days of meds, (which is different than the previous message).  Anyway, she said she is out meds today.  Pls send rest of script in to [facility] in [place].  Next appt 4/9

## 2022-09-23 ENCOUNTER — Telehealth: Payer: Self-pay | Admitting: Psychiatry

## 2022-09-23 DIAGNOSIS — F3181 Bipolar II disorder: Secondary | ICD-10-CM

## 2022-09-23 DIAGNOSIS — F17210 Nicotine dependence, cigarettes, uncomplicated: Secondary | ICD-10-CM

## 2022-09-23 NOTE — Telephone Encounter (Signed)
Is she getting any benefit for smoking cessation or energy?.  There is a shorter acting version of Wellbutrin available (SR instead of XL) which would not cause night sweats.  If she is getting some benefit from this it would make sense to switch to the shorter acting version before trying something like modafinil.  Modafinil will not help smoking cessation but it might help energy and focus.

## 2022-09-23 NOTE — Telephone Encounter (Signed)
From last note:  Wellbutrin XL 150 AM, then 300 mg AM To help energy mood and can help stop smoking. Backup plan modafinil  She said she is having intense night sweats. She thought she would adjust to the medication but now that she is taking 300 mg she said it just isn't going to work.  She is asking for Modafinil.

## 2022-09-23 NOTE — Telephone Encounter (Signed)
Patient called in stating that she started taking Bupropion and ever she has been experiencing "severe night sweats". She thought it would stop once medicine got into her system but has only gotten worse. She said that she and CC discussed another medication she believes it is Modanfinil. Pls rtc [phone] Appt 4/9

## 2022-09-24 ENCOUNTER — Other Ambulatory Visit: Payer: Self-pay | Admitting: Psychiatry

## 2022-09-24 MED ORDER — BUPROPION HCL ER (SR) 100 MG PO TB12: 100.0000 mg | ORAL_TABLET | Freq: Every morning | ORAL | 1 refills | Status: DC

## 2022-09-24 NOTE — Telephone Encounter (Signed)
Sent Wellbutrin SR 100 mg AM and DC XL versions.

## 2022-09-24 NOTE — Telephone Encounter (Signed)
Patient not getting benefit for smoking cessation. She said she would try the Wellbutrin SR until her next appt if that is the way to go.

## 2022-10-28 ENCOUNTER — Encounter: Payer: Self-pay | Admitting: Psychiatry

## 2022-10-28 ENCOUNTER — Ambulatory Visit (INDEPENDENT_AMBULATORY_CARE_PROVIDER_SITE_OTHER): Payer: 59 | Admitting: Psychiatry

## 2022-10-28 DIAGNOSIS — F4001 Agoraphobia with panic disorder: Secondary | ICD-10-CM

## 2022-10-28 DIAGNOSIS — F3181 Bipolar II disorder: Secondary | ICD-10-CM | POA: Diagnosis not present

## 2022-10-28 DIAGNOSIS — F411 Generalized anxiety disorder: Secondary | ICD-10-CM

## 2022-10-28 DIAGNOSIS — F17210 Nicotine dependence, cigarettes, uncomplicated: Secondary | ICD-10-CM

## 2022-10-28 DIAGNOSIS — F4310 Post-traumatic stress disorder, unspecified: Secondary | ICD-10-CM

## 2022-10-28 DIAGNOSIS — R5383 Other fatigue: Secondary | ICD-10-CM

## 2022-10-28 DIAGNOSIS — F5105 Insomnia due to other mental disorder: Secondary | ICD-10-CM

## 2022-10-28 MED ORDER — BUPROPION HCL ER (XL) 150 MG PO TB24: 150.0000 mg | ORAL_TABLET | Freq: Every day | ORAL | 0 refills | Status: DC

## 2022-10-28 MED ORDER — MODAFINIL 200 MG PO TABS: 200.0000 mg | ORAL_TABLET | Freq: Every day | ORAL | 2 refills | Status: DC

## 2022-10-28 NOTE — Progress Notes (Signed)
Lindsey [name] [id number] 1976/07/05 47 y.o.   Subjective:   Patient ID:  Lindsey [name] is a 47 y.o. (DOB 11-25-1975) female.  Chief Complaint:  Chief Complaint  Patient presents with   Follow-up   Depression   Anxiety   Fatigue    Depression        Associated symptoms include no decreased concentration, no fatigue and no suicidal ideas.  Past medical history includes anxiety.   Anxiety Patient reports no chest pain, confusion, decreased concentration, nervous/anxious behavior, palpitations or suicidal ideas.      Lindsey [name] presents to today for follow-up of mood and anxiety.  When seen April 27th, 2020.  She was hypomanic at the time and having insomnia.  We increased Xanax 1 mg nightly and she was encouraged to increase oxcarbazepine to 600 mg nightly to help with the hypomania.  She is fairly sensitive to mood stabilizers and has failed several.  She returned in June reporting hypomania resolved.  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  Finally ended the trauma bond and is better.  Trileptal and Xanax at night is better but only taking 1/2 of 300mg  Trileptal bc sleepiness from 300 mg hs.  She's taking Xanax 0.5 mg AM and HS.  Getting a lot done.  Problematic boss got fired for performance problems.  No meds were changed at the June visit.  seen October 2020 the following was noted:She is now hypomanic with no particular trigger.  This may be associated with a tendency toward spring hypomania and bipolar patients. Chronic compliance problems with addition of meds.  Rec restart Trileptal 150 mg HS. This is off label for bipolar mania or hypomania.  She is fairly sensitive to mood stabilizers historically.  She does not want weight gain.   November 17, 2019 appointment, the following is noted: Been working on self growth and taking advantages of resources on internet and self esteem.  Pleased with meds. Not taken Trileptal.   Wonders if she has  borderline pd bc splitting and black and white thinking.  Mood swings can be rapid from 30 min to 3 days.  Also triggered by abandonment. Chronic struggles with intrusive mother who she feels triggers her. Panic triggered only with one person.  Social anxiety is less. Working from home helps. Patient reports stable mood and denies depressed or irritable moods.    Patient denies difficulty with sleep initiation or maintenance. Denies appetite disturbance.  Patient reports that energy and motivation have been good.  Patient denies any difficulty with concentration.  Patient denies any suicidal ideation. Plan no med changes  03/15/20 appt. With the following noted:   Wrote her out of work for 3 weeks DT multiple stressors with "mental health overload" including father of child facing jail then mistake at work.  Intense sensitivity to failure.  Couldn't eat and lost 13# in 3 weeks.  Needed break from work to manage stress.  Break was very helpful at restoration. Now doing OK.   Working from home has been good for her.  May ask for excuse to work part of the time from home. Started B12 and D3 which helped energy.  Not overly depressed.  No mood swings.  Incredible fear of abandonment also flared up in rel with current BF. Patient reports stable mood and denies depressed or irritable moods.   Patient denies difficulty with sleep initiation or maintenance.  NM if naps.  Denies appetite disturbance.  Patient reports that  energy and motivation have been good.  Patient denies any difficulty with concentration.  Patient denies any suicidal ideation. Plan: No med changes  11/19/2020 appointment with the following noted: Doing OK.  Meds working fine.  Doing Ok spiritually. Concern is so fatigued.  Not sure the cause.  Wonders about vitamin deficiency.  Naps when she can.  Not like a depressed fatigue. Sleep is OK.  Average 5 hours nightly during the week.  Partly bc it's quiet. In this pattern for a month or  so. Big project at work requiring all attention and effort during the day. 20% raise. Drinks caffeine  All day and has for years.  03/27/2021 appointment with the following noted: Doing alright.  Still work stress.  Company bought out.  So expects to get laid off.   Takes Xanax 0.5 mg AM and then 0.25 prn about 2-3 times per month.  Reaches for propranolol first.  Inderall helped anxiety a whole lot. Stress M recently psychotic and hospitalized and has Poor coping and severe fear of abandoment.. Pt is inconsistent and maybe PMS related and may nap 2-3 hours.  May sleep to shut off the world for a little. Overall is doing OK and at baseline.  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   Still on lamotrigine 150 AM and 300 HS. No SE. Plan: Be careful with caffeine bc anxiety. No med changes indicated  08/15/2021 appointment with the following noted: Situational depression.  Got a raise and not laid off.   Position with corporate team and permanent now.  Will be different after 18 years at [employer].   Still on lamotrigine 150 AM and 300 HS.. Stable depression and working on herself.  Getting better. Want to get stuff done around the house but hard to make herself do it. Sleep pattern terrible with naps and split schedule but getting 8 hours of sleep.  Likes staying up late to have quiet.   D will be 13 in May.  Hates [employer] and declining grades.    Needs transfer.   No concerns about meds. Bad panic in November with trigger of seeing someone with residual sx for a week.  Worst in a long time.  Was trauma trigger. Plan: No med changes  12/18/2021 appointment with the following noted: Doing good.  A lot of stress since here and managed well.  Worked on attachment issues in therapy.   Changed Xnanx to 0.5 mg 1/2 BID and it's helped to split it out.  Helped anxiety to do that. Taking [name]'s father to court for support and custody. [name] a lot  of mental health problems including anxiety. Working on losing weight counting calories. Lost 18#. Work from home FT good. Patient reports stable mood and denies depressed or irritable moods.   Patient denies difficulty with sleep initiation or maintenance. Denies appetite disturbance.  Patient reports that energy and motivation have been good.  Patient denies any difficulty with concentration.  Patient denies any suicidal ideation.  04/21/22 appt noted" Summer stress with anxierty overwhelmed.  Not managed with Xanax and propranolol.  RX clonidine off label.  Talked with friend and never tried it bc it got better. Anxity pretty good now.  Was taking propranolol 30 mg daily and now 20 mg daily. Some cycling of fatigue and napping.  A little irritability.  No mania. Tolerating meds. Xanax 0.25 mg AM.  Only. Sleep schedule not always regular. Positives work raise.  Tori into charter school and pleased with change.  08/26/22 appt noted: Something is wrong. Mid Dec had to take 2 days off work bc depressed and burned out.  Cannot seem to pull up out of it.  Not sad but executive function freeze with amotivation.  Trouble with focus.  Can't pull herself out of it.  Enjoyment 4/10.  Affecting work.  Not much anxiety.  Sleep pattern erratic with average 5 hours and hard to make herself go to bed bc night is her free time.  Normal 7-8 hours.  Some naps. Plan: Wellbutrin trial  10/28/22 appt noted: Got up to 300 XL wellbutrin with night sweats that eventually stopped.  Mixed feels about it.  SE Problems getting words out.  Having wt gain.  Didn't improve energy or motivation.   Smoking comes and goes but stopped smoking pot with it.  And has a negative feeling on pot now.  None for a month.   Not jittery or edgy now and was jaw clenching but resolved. No sig sadness.    Burnt a bridge with someone that was necessary.  Released some anger.   Getting enough sleep.  Xanax more calming than the  others.  Lindsey [name] is 47 yo.  Her father is alcoholic.  Last hypomania was November 2018 and then again April 2018.  Past Psychiatric Medication Trials: Poor response SSRIs,  Effexor, Paxil, sertraline itchy, Wellbutrin, Serzone, Strattera nausea,  Vraylar, olanzapine sedation, Latuda fogginess, risperidone, Abilify, Trileptal 300 tired, lamotrigine,  lithium, propranolol,    Klonopin, Ativan, Xanax, buspirone nausea NAC,   Review of Systems:  Review of Systems  Constitutional:  Negative for fatigue.  Cardiovascular:  Negative for chest pain and palpitations.  Neurological:  Negative for tremors.  Psychiatric/Behavioral:  Positive for depression. Negative for agitation, behavioral problems, confusion, decreased concentration, dysphoric mood, hallucinations, self-injury, sleep disturbance and suicidal ideas. The patient is not nervous/anxious and is not hyperactive.     Medications: I have reviewed the patient's current medications.  Current Outpatient Medications  Medication Sig Dispense Refill   acetaminophen-codeine (TYLENOL #3) 300-30 MG tablet Take 1 tablet by mouth every 8 (eight) hours as needed.     ALPRAZolam (XANAX) 0.5 MG tablet Take 1 tablet (0.5 mg total) by mouth every 8 (eight) hours as needed. 90 tablet 3   buPROPion (WELLBUTRIN XL) 150 MG 24 hr tablet Take 1 tablet (150 mg total) by mouth daily. 90 tablet 0   Cholecalciferol (VITAMIN D3) 25 MCG (1000 UT) CAPS Take by mouth.     Cyanocobalamin (VITAMIN B12) 1000 MCG TBCR 1 tablet     lamoTRIgine (LAMICTAL) 150 MG tablet Take 1 tablet by mouth every morning and take 2 tablets by mouth every evening. 270 tablet 1   levonorgestrel (MIRENA) 20 MCG/24HR IUD 1 each by Intrauterine route once.     propranolol (INDERAL) 10 MG tablet Take 1 tablet (10 mg total) by mouth 3 (three) times daily as needed. (Patient taking differently: Take 10 mg by mouth 3 (three) times daily as needed. PRN) 270 tablet 1   triamcinolone ointment  (KENALOG) 0.1 % Apply 1 application. topically 2 (two) times daily as needed (Rash). 454 g 11   modafinil (PROVIGIL) 200 MG tablet Take 1 tablet (200 mg total) by mouth daily. (Patient not taking: Reported on 10/28/2022) 30 tablet 2   No current facility-administered medications for this visit.    Medication Side Effects: None  Allergies:  Allergies  Allergen Reactions   Celexa [Citalopram]  Other reaction(s): tremor    Past Medical History:  Diagnosis Date   Other and unspecified disc disorder of lumbar region    torn lumbar disc?    Family History  Problem Relation Age of Onset   Diabetes Mother    Diabetes Father    Cancer Father        kidney   Cancer Paternal Grandfather        PROSTATE    Social History   Socioeconomic History   Marital status: Single    Spouse name: Not on file   Number of children: Not on file   Years of education: Not on file   Highest education level: Not on file  Occupational History   Not on file  Tobacco Use   Smoking status: Every Day    Packs/day: .75    Types: Cigarettes   Smokeless tobacco: Never  Vaping Use   Vaping Use: Never used  Substance and Sexual Activity   Alcohol use: No   Drug use: No   Sexual activity: Yes    Birth control/protection: I.U.D., Condom    Comment: 1st intercourse- 24, partners- 5, mirena inserted 04-16-17  Other Topics Concern   Not on file  Social History Narrative   Not on file   Social Determinants of Health   Financial Resource Strain: Not on file  Food Insecurity: Not on file  Transportation Needs: Not on file  Physical Activity: Not on file  Stress: Not on file  Social Connections: Not on file  Intimate Partner Violence: Not on file    Past Medical History, Surgical history, Social history, and Family history were reviewed and updated as appropriate.   Please see review of systems for further details on the patient's review from today.   Objective:   Physical Exam:  There  were no vitals taken for this visit.  Physical Exam Constitutional:      Appearance: She is obese.  Musculoskeletal:        General: No deformity.  Neurological:     Mental Status: She is alert.     Cranial Nerves: No dysarthria.     Motor: No weakness or tremor.     Gait: Gait normal.  Psychiatric:        Attention and Perception: Attention and perception normal.        Mood and Affect: Mood is not anxious or depressed.        Speech: Speech is not rapid and pressured, slurred or tangential.        Behavior: Behavior normal. Behavior is cooperative.        Thought Content: Thought content normal. Thought content is not delusional. Thought content does not include suicidal ideation.        Cognition and Memory: Cognition and memory normal. Cognition is not impaired. She does not exhibit impaired recent memory.        Judgment: Judgment normal.     Comments: Good mood stability overall But blah and flat       Lab Review:     Component Value Date/Time   NA 139 04/10/2021 1324   K 4.0 04/10/2021 1324   CL 106 04/10/2021 1324   CO2 27 04/10/2021 1324   GLUCOSE 88 04/10/2021 1324   BUN 9 04/10/2021 1324   CREATININE 0.70 04/10/2021 1324   CALCIUM 9.1 04/10/2021 1324   PROT 6.4 04/10/2021 1324   ALBUMIN 4.2 08/18/2016 0838   AST 14 04/10/2021 1324   ALT 20 04/10/2021  1324   ALKPHOS 65 08/18/2016 0838   BILITOT 0.4 04/10/2021 1324       Component Value Date/Time   WBC 8.2 04/10/2021 1324   RBC 4.36 04/10/2021 1324   HGB 14.3 04/10/2021 1324   HCT 41.5 04/10/2021 1324   PLT 266 04/10/2021 1324   MCV 95.2 04/10/2021 1324   MCH 32.8 04/10/2021 1324   MCHC 34.5 04/10/2021 1324   RDW 12.0 04/10/2021 1324   LYMPHSABS 2,763 04/10/2021 1324   MONOABS 292 08/18/2016 0838   EOSABS 607 (H) 04/10/2021 1324   BASOSABS 74 04/10/2021 1324    No results found for: "POCLITH", "LITHIUM"   No results found for: "PHENYTOIN", "PHENOBARB", "VALPROATE", "CBMZ"   .res Assessment:  Plan:    Bipolar II disorder - Plan: buPROPion (WELLBUTRIN XL) 150 MG 24 hr tablet, modafinil (PROVIGIL) 200 MG tablet  Cigarette nicotine dependence without complication  Panic disorder with agoraphobia  Generalized anxiety disorder  Insomnia due to mental condition  Fatigue, unspecified type - Plan: modafinil (PROVIGIL) 200 MG tablet  PTSD (post-traumatic stress disorder)  Medsensitive.  30 min session   Lindsey [name] has a long history of bipolar disorder.  Usually manic episodes are mild.  Her mother also has bipolar disorder.  She has failed numerous mood stabilizers as noted above.  She is now hypomanic with no particular trigger.  This may be associated with a tendency toward spring hypomania and bipolar patients. Chronic compliance problems with addition of meds.   Option Trileptal 150 mg HS. This is off label for bipolar mania or hypomania.  She is fairly sensitive to mood stabilizers historically.  She does not want weight gain.  That limits options given the failures noted above.  Discussed side effects in detail.  She agrees to this plan.   She feels self help has been beneficial sns she doesn't need more med.  Prefers sheild-shaped lamotrigine and oval alprazolam vs round of each in terms of effectivenss.  Asked her to get the generic manufacturer names for these and we can request them.  Disc differences with generics.  Sleep deprived during the week and too much caffeine.  Disc sleep hygiene.  Stop caffeine at 3 instead of 7 PM.  This may be the cause of the insomnia during the week.  B12 and folate levels normal.  The propranolol was very helpful for her anxiety.  It also controlled the palpitations. Try it before naps to see if it prevents NM. Consider doxazosin.  We discussed the short-term risks associated with benzodiazepines including sedation and increased fall risk among others.  Discussed long-term side effect risk including dependence, potential withdrawal symptoms,  and the potential eventual dose-related risk of dementia.  Xanax helped a lot with normal and social anxiety. Continue Xanax and propranolol prn.  Patient needs to sleep in order to control the hypomania.  Limit Xanax DT fatigue.  Option Caplyta but likely need lower dose and those samples not available. Disc mechanism of action.  Supportive therapy on managing the recent stressors and disc recent LOA from work for mental health reasons.  Was helpful.  Disc possible return to in office from work at home issues. Disc getting D psych help.  Get more sleep  Wellbutrin reduce to XL 150 AM for at least 8 mos To help energy mood and can help stop smoking but also helped her stop pot for a month. Backup plan modafinil  Yes modafinil 100 to 200 mg AM  FU 2  mos.  Iona Hansen  Jennelle Human, MD, DFAPA     Please see After Visit Summary for patient specific instructions.  No future appointments.   No orders of the defined types were placed in this encounter.      -------------------------------

## 2022-12-12 ENCOUNTER — Other Ambulatory Visit: Payer: Self-pay | Admitting: Family Medicine

## 2022-12-12 DIAGNOSIS — Z1231 Encounter for screening mammogram for malignant neoplasm of breast: Secondary | ICD-10-CM

## 2022-12-23 ENCOUNTER — Other Ambulatory Visit: Payer: Self-pay | Admitting: Psychiatry

## 2022-12-23 DIAGNOSIS — F3181 Bipolar II disorder: Secondary | ICD-10-CM

## 2022-12-24 ENCOUNTER — Telehealth: Payer: Self-pay | Admitting: Psychiatry

## 2022-12-24 NOTE — Telephone Encounter (Signed)
Pharmacy sent PA request for Modafinil 200mg  , see CMM

## 2023-01-06 ENCOUNTER — Other Ambulatory Visit: Payer: Self-pay | Admitting: Psychiatry

## 2023-01-07 ENCOUNTER — Telehealth: Payer: Self-pay | Admitting: Psychiatry

## 2023-01-07 ENCOUNTER — Other Ambulatory Visit: Payer: Self-pay | Admitting: Psychiatry

## 2023-01-07 ENCOUNTER — Ambulatory Visit: Payer: 59

## 2023-01-07 DIAGNOSIS — F3181 Bipolar II disorder: Secondary | ICD-10-CM

## 2023-01-07 DIAGNOSIS — R5383 Other fatigue: Secondary | ICD-10-CM

## 2023-01-07 MED ORDER — MODAFINIL 200 MG PO TABS
ORAL_TABLET | ORAL | 1 refills | Status: DC
Start: 2023-01-07 — End: 2023-03-11

## 2023-01-07 NOTE — Telephone Encounter (Signed)
Pt called and said that she is taking 2 pills of the modafinil 200 mg instead of one. She needs a new [facility] [address] in [place]. She also said that she is in a depressive episode and is crying. This has been going on for 5 days . Please call her at [phone]

## 2023-01-07 NOTE — Telephone Encounter (Signed)
Patient reports taking 2 tablets of 200 mg Modafinil for about 1-1/2 weeks. She felt like the 200 mg was not working. She is asking for a new Rx because she will run out earlier. Last filled 6/5. She also reports taking Zepbound and she read that it may affect absorption of medication. Reporting increased depression. No new stressors, feels like it is just the cycle of her bipolar. Sleeping fine, just feeling low and tearing up some days. Her affect was not flat, not tearful. Last visit 4/9, FU 7/9.   Taking: Alprazolam 0.25 mg every day. Said you just left Rx as 0.5 in case she needed it. Bupropion 150 mg - 3 QAM Propranolol 10 mg - 2 QAM Modafinil 200 mg - taking 400 mg

## 2023-01-07 NOTE — Telephone Encounter (Signed)
She is at max dose of current lamotrigine and Wellbutrin for dep which means another option will be necessary.  I need to see her to switch meds. I can send in modafinil 200 mg 2 daily but she cannot go higher than that and I'm not sure insurance will cover this dose.  I assume by requesting this the increase was useful?

## 2023-01-08 NOTE — Telephone Encounter (Signed)
Patient uses [facility] for modafinil and reports improvement in focus with increased dose. Response from Dr. Jennelle Human provided.  She had also requested RF on Wellbutrin and that was sent.  She got a 90-day fill and is not due yet, but said she was out of medication.

## 2023-01-08 NOTE — Telephone Encounter (Signed)
Patient called in with refill request for Bupropion 150mg . States that she is unsure of what happened to some of her pills if they were lost or misplaced but she is completely out. Ph: [phone] Appt 7/9 Pharmacy [facility] [address] Hassell Halim

## 2023-01-23 ENCOUNTER — Ambulatory Visit
Admission: RE | Admit: 2023-01-23 | Discharge: 2023-01-23 | Disposition: A | Discharge status: Home / Self Care | Payer: 59 | Source: Ambulatory Visit | Attending: Family Medicine | Admitting: Family Medicine

## 2023-01-23 DIAGNOSIS — Z1231 Encounter for screening mammogram for malignant neoplasm of breast: Secondary | ICD-10-CM

## 2023-01-27 ENCOUNTER — Encounter: Payer: Self-pay | Admitting: Psychiatry

## 2023-01-27 ENCOUNTER — Ambulatory Visit (INDEPENDENT_AMBULATORY_CARE_PROVIDER_SITE_OTHER): Payer: 59 | Admitting: Psychiatry

## 2023-01-27 DIAGNOSIS — R5383 Other fatigue: Secondary | ICD-10-CM | POA: Diagnosis not present

## 2023-01-27 DIAGNOSIS — F411 Generalized anxiety disorder: Secondary | ICD-10-CM

## 2023-01-27 DIAGNOSIS — F3181 Bipolar II disorder: Secondary | ICD-10-CM

## 2023-01-27 DIAGNOSIS — F17210 Nicotine dependence, cigarettes, uncomplicated: Secondary | ICD-10-CM

## 2023-01-27 DIAGNOSIS — F4310 Post-traumatic stress disorder, unspecified: Secondary | ICD-10-CM

## 2023-01-27 DIAGNOSIS — F4001 Agoraphobia with panic disorder: Secondary | ICD-10-CM

## 2023-01-27 DIAGNOSIS — F5105 Insomnia due to other mental disorder: Secondary | ICD-10-CM

## 2023-01-27 MED ORDER — ALPRAZOLAM 0.25 MG PO TABS: 0.2500 mg | ORAL_TABLET | Freq: Three times a day (TID) | ORAL | 0 refills | Status: DC | PRN

## 2023-01-27 NOTE — Progress Notes (Signed)
Lindsey [name] [id number] [birth year] 47 y.o.   Subjective:   Patient ID:  Lindsey [name] is a 47 y.o. (DOB [birth year]) female.  Chief Complaint:  Chief Complaint  Patient presents with   Follow-up   Depression   Fatigue    Depression        Associated symptoms include no decreased concentration, no fatigue and no suicidal ideas.  Past medical history includes anxiety.   Anxiety Patient reports no chest pain, confusion, decreased concentration, nervous/anxious behavior, palpitations or suicidal ideas.      Lindsey [name] presents to today for follow-up of mood and anxiety.  When seen April 27th, 2020.  She was hypomanic at the time and having insomnia.  We increased Xanax 1 mg nightly and she was encouraged to increase oxcarbazepine to 600 mg nightly to help with the hypomania.  She is fairly sensitive to mood stabilizers and has failed several.  She returned in June reporting hypomania resolved.  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  Finally ended the trauma bond and is better.  Trileptal and Xanax at night is better but only taking 1/2 of 300mg  Trileptal bc sleepiness from 300 mg hs.  She's taking Xanax 0.5 mg AM and HS.  Getting a lot done.  Problematic boss got fired for performance problems.  No meds were changed at the June visit.  seen October 2020 the following was noted:She is now hypomanic with no particular trigger.  This may be associated with a tendency toward spring hypomania and bipolar patients. Chronic compliance problems with addition of meds.  Rec restart Trileptal 150 mg HS. This is off label for bipolar mania or hypomania.  She is fairly sensitive to mood stabilizers historically.  She does not want weight gain.   November 17, 2019 appointment, the following is noted: Been working on self growth and taking advantages of resources on internet and self esteem.  Pleased with meds. Not taken Trileptal.   Wonders if she has borderline pd bc  splitting and black and white thinking.  Mood swings can be rapid from 30 min to 3 days.  Also triggered by abandonment. Chronic struggles with intrusive mother who she feels triggers her. Panic triggered only with one person.  Social anxiety is less. Working from home helps. Patient reports stable mood and denies depressed or irritable moods.    Patient denies difficulty with sleep initiation or maintenance. Denies appetite disturbance.  Patient reports that energy and motivation have been good.  Patient denies any difficulty with concentration.  Patient denies any suicidal ideation. Plan no med changes  03/15/20 appt. With the following noted:   Wrote her out of work for 3 weeks DT multiple stressors with "mental health overload" including father of child facing jail then mistake at work.  Intense sensitivity to failure.  Couldn't eat and lost 13# in 3 weeks.  Needed break from work to manage stress.  Break was very helpful at restoration. Now doing OK.   Working from home has been good for her.  May ask for excuse to work part of the time from home. Started B12 and D3 which helped energy.  Not overly depressed.  No mood swings.  Incredible fear of abandonment also flared up in rel with current BF. Patient reports stable mood and denies depressed or irritable moods.   Patient denies difficulty with sleep initiation or maintenance.  NM if naps.  Denies appetite disturbance.  Patient reports that energy and motivation  have been good.  Patient denies any difficulty with concentration.  Patient denies any suicidal ideation. Plan: No med changes  11/19/2020 appointment with the following noted: Doing OK.  Meds working fine.  Doing Ok spiritually. Concern is so fatigued.  Not sure the cause.  Wonders about vitamin deficiency.  Naps when she can.  Not like a depressed fatigue. Sleep is OK.  Average 5 hours nightly during the week.  Partly bc it's quiet. In this pattern for a month or so. Big project at  work requiring all attention and effort during the day. 20% raise. Drinks caffeine  All day and has for years.  03/27/2021 appointment with the following noted: Doing alright.  Still work stress.  Company bought out.  So expects to get laid off.   Takes Xanax 0.5 mg AM and then 0.25 prn about 2-3 times per month.  Reaches for propranolol first.  Inderall helped anxiety a whole lot. Stress M recently psychotic and hospitalized and has Poor coping and severe fear of abandoment.. Pt is inconsistent and maybe PMS related and may nap 2-3 hours.  May sleep to shut off the world for a little. Overall is doing OK and at baseline.  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   Still on lamotrigine 150 AM and 300 HS. No SE. Plan: Be careful with caffeine bc anxiety. No med changes indicated  08/15/2021 appointment with the following noted: Situational depression.  Got a raise and not laid off.   Position with corporate team and permanent now.  Will be different after 18 years at [employer].   Still on lamotrigine 150 AM and 300 HS.. Stable depression and working on herself.  Getting better. Want to get stuff done around the house but hard to make herself do it. Sleep pattern terrible with naps and split schedule but getting 8 hours of sleep.  Likes staying up late to have quiet.   D will be 13 in May.  Hates [employer] and declining grades.    Needs transfer.   No concerns about meds. Bad panic in November with trigger of seeing someone with residual sx for a week.  Worst in a long time.  Was trauma trigger. Plan: No med changes  12/18/2021 appointment with the following noted: Doing good.  A lot of stress since here and managed well.  Worked on attachment issues in therapy.   Changed Xnanx to 0.5 mg 1/2 BID and it's helped to split it out.  Helped anxiety to do that. Taking [name]'s father to court for support and custody. [name] a lot of mental health  problems including anxiety. Working on losing weight counting calories. Lost 18#. Work from home FT good. Patient reports stable mood and denies depressed or irritable moods.   Patient denies difficulty with sleep initiation or maintenance. Denies appetite disturbance.  Patient reports that energy and motivation have been good.  Patient denies any difficulty with concentration.  Patient denies any suicidal ideation.  04/21/22 appt noted" Summer stress with anxierty overwhelmed.  Not managed with Xanax and propranolol.  RX clonidine off label.  Talked with friend and never tried it bc it got better. Anxity pretty good now.  Was taking propranolol 30 mg daily and now 20 mg daily. Some cycling of fatigue and napping.  A little irritability.  No mania. Tolerating meds. Xanax 0.25 mg AM.  Only. Sleep schedule not always regular. Positives work raise.   Tori  into charter school and pleased with change.  08/26/22 appt noted: Something is wrong. Mid Dec had to take 2 days off work bc depressed and burned out.  Cannot seem to pull up out of it.  Not sad but executive function freeze with amotivation.  Trouble with focus.  Can't pull herself out of it.  Enjoyment 4/10.  Affecting work.  Not much anxiety.  Sleep pattern erratic with average 5 hours and hard to make herself go to bed bc night is her free time.  Normal 7-8 hours.  Some naps. Plan: Wellbutrin trial  10/28/22 appt noted: Got up to 300 XL wellbutrin with night sweats that eventually stopped.  Mixed feels about it.  SE Problems getting words out.  Having wt gain.  Didn't improve energy or motivation.   Smoking comes and goes but stopped smoking pot with it.  And has a negative feeling on pot now.  None for a month.   Not jittery or edgy now and was jaw clenching but resolved. No sig sadness.    Burnt a bridge with someone that was necessary.  Released some anger.   Getting enough sleep. Plan: Yes modafinil 100 to 200 mg AM  01/27/23 appt  noted: Increased modafinil 400 mg AM.  No SE.  She thinks it hleped to increase it  Continues Wellbutrin XL 150 AM, lamotrigine 150 AM and 300 PM. Has desire to do things but poor inititative and productivity.  Doing bare minimum.  Not sad.  BR is reflective of mental health but it is a mess.   Not tired. No swings.  Xanax more calming than the others.  [name] is 64 yo.  Her father is alcoholic.  Last hypomania was November 2018 and then again April 2018.  Past Psychiatric Medication Trials: Poor response SSRIs,  Effexor, Paxil, sertraline itchy, Wellbutrin, Serzone, Strattera nausea,  Vraylar, olanzapine sedation, Latuda fogginess, risperidone, Abilify, Trileptal 300 tired, lamotrigine,  lithium, propranolol,    Klonopin, Ativan, Xanax, buspirone nausea NAC,   Review of Systems:  Review of Systems  Constitutional:  Negative for fatigue.  Cardiovascular:  Negative for chest pain and palpitations.  Neurological:  Negative for tremors.  Psychiatric/Behavioral:  Positive for depression. Negative for agitation, behavioral problems, confusion, decreased concentration, dysphoric mood, hallucinations, self-injury, sleep disturbance and suicidal ideas. The patient is not nervous/anxious and is not hyperactive.     Medications: I have reviewed the patient's current medications.  Current Outpatient Medications  Medication Sig Dispense Refill   acetaminophen-codeine (TYLENOL #3) 300-30 MG tablet Take 1 tablet by mouth every 8 (eight) hours as needed.     buPROPion (WELLBUTRIN XL) 150 MG 24 hr tablet TAKE 1 TABLET BY MOUTH EVERY DAY 90 tablet 0   Cholecalciferol (VITAMIN D3) 25 MCG (1000 UT) CAPS Take by mouth.     Cyanocobalamin (VITAMIN B12) 1000 MCG TBCR 1 tablet     lamoTRIgine (LAMICTAL) 150 MG tablet Take 1 tablet by mouth every morning and take 2 tablets by mouth every evening. 270 tablet 0   levonorgestrel (MIRENA) 20 MCG/24HR IUD 1 each by Intrauterine route once.     modafinil  (PROVIGIL) 200 MG tablet 1 in the AM and noon 60 tablet 1   propranolol (INDERAL) 10 MG tablet Take 1 tablet (10 mg total) by mouth 3 (three) times daily as needed. (Patient taking differently: Take 10 mg by mouth 3 (three) times daily as needed. PRN) 270 tablet 1   triamcinolone ointment (KENALOG) 0.1 % Apply 1 application.  topically 2 (two) times daily as needed (Rash). 454 g 11   ALPRAZolam (XANAX) 0.25 MG tablet Take 1 tablet (0.25 mg total) by mouth every 8 (eight) hours as needed. 90 tablet 0   No current facility-administered medications for this visit.    Medication Side Effects: None  Allergies:  Allergies  Allergen Reactions   Celexa [Citalopram]     Other reaction(s): tremor    Past Medical History:  Diagnosis Date   Other and unspecified disc disorder of lumbar region    torn lumbar disc?    Family History  Problem Relation Age of Onset   Diabetes Mother    Diabetes Father    Cancer Father        kidney   Cancer Paternal Grandfather        PROSTATE    Social History   Socioeconomic History   Marital status: Single    Spouse name: Not on file   Number of children: Not on file   Years of education: Not on file   Highest education level: Not on file  Occupational History   Not on file  Tobacco Use   Smoking status: Every Day    Packs/day: .75    Types: Cigarettes   Smokeless tobacco: Never  Vaping Use   Vaping Use: Never used  Substance and Sexual Activity   Alcohol use: No   Drug use: No   Sexual activity: Yes    Birth control/protection: I.U.D., Condom    Comment: 1st intercourse- 24, partners- 5, mirena inserted 04-16-17  Other Topics Concern   Not on file  Social History Narrative   Not on file   Social Determinants of Health   Financial Resource Strain: Not on file  Food Insecurity: Not on file  Transportation Needs: Not on file  Physical Activity: Not on file  Stress: Not on file  Social Connections: Not on file  Intimate Partner  Violence: Not on file    Past Medical History, Surgical history, Social history, and Family history were reviewed and updated as appropriate.   Please see review of systems for further details on the patient's review from today.   Objective:   Physical Exam:  LMP  (LMP Unknown)   Physical Exam Constitutional:      Appearance: She is obese.  Musculoskeletal:        General: No deformity.  Neurological:     Mental Status: She is alert.     Cranial Nerves: No dysarthria.     Motor: No weakness or tremor.     Gait: Gait normal.  Psychiatric:        Attention and Perception: Attention and perception normal.        Mood and Affect: Mood is not anxious or depressed.        Speech: Speech is not rapid and pressured, slurred or tangential.        Behavior: Behavior normal. Behavior is cooperative.        Thought Content: Thought content normal. Thought content is not delusional. Thought content does not include suicidal ideation.        Cognition and Memory: Cognition and memory normal. Cognition is not impaired. She does not exhibit impaired recent memory.        Judgment: Judgment normal.     Comments: Good mood stability overall But blah and flat       Lab Review:     Component Value Date/Time   NA 139 04/10/2021 1324  K 4.0 04/10/2021 1324   CL 106 04/10/2021 1324   CO2 27 04/10/2021 1324   GLUCOSE 88 04/10/2021 1324   BUN 9 04/10/2021 1324   CREATININE 0.70 04/10/2021 1324   CALCIUM 9.1 04/10/2021 1324   PROT 6.4 04/10/2021 1324   ALBUMIN 4.2 08/18/2016 0838   AST 14 04/10/2021 1324   ALT 20 04/10/2021 1324   ALKPHOS 65 08/18/2016 0838   BILITOT 0.4 04/10/2021 1324       Component Value Date/Time   WBC 8.2 04/10/2021 1324   RBC 4.36 04/10/2021 1324   HGB 14.3 04/10/2021 1324   HCT 41.5 04/10/2021 1324   PLT 266 04/10/2021 1324   MCV 95.2 04/10/2021 1324   MCH 32.8 04/10/2021 1324   MCHC 34.5 04/10/2021 1324   RDW 12.0 04/10/2021 1324   LYMPHSABS 2,763  04/10/2021 1324   MONOABS 292 08/18/2016 0838   EOSABS 607 (H) 04/10/2021 1324   BASOSABS 74 04/10/2021 1324    No results found for: "POCLITH", "LITHIUM"   No results found for: "PHENYTOIN", "PHENOBARB", "VALPROATE", "CBMZ"   .res Assessment: Plan:    Bipolar II disorder (HCC)  Fatigue, unspecified type  Panic disorder with agoraphobia - Plan: ALPRAZolam (XANAX) 0.25 MG tablet  Generalized anxiety disorder - Plan: ALPRAZolam (XANAX) 0.25 MG tablet  Insomnia due to mental condition  PTSD (post-traumatic stress disorder)  Cigarette nicotine dependence without complication  Medsensitive.  30 min session   Yamilee has a long history of bipolar  2 disorder.  Usually manic episodes are mild.  Her mother also has bipolar disorder.  She has failed numerous mood stabilizers as noted above.    Option Trileptal 150 mg HS. This is off label for bipolar mania or hypomania.  She is fairly sensitive to mood stabilizers historically.  She does not want weight gain.  That limits options given the failures noted above.  Discussed side effects in detail.  She agrees to this plan.   She feels self help has been beneficial sns she doesn't need more med.  Is getting benefit with anxiety and irritability with Wellbutrin.  Prefers sheild-shaped lamotrigine and oval alprazolam vs round of each in terms of effectivenss.  Asked her to get the generic manufacturer names for these and we can request them.  Disc differences with generics.  Sleep deprived during the week and too much caffeine.  Disc sleep hygiene.  Stop caffeine at 3 instead of 7 PM.  This may be the cause of the insomnia during the week.  B12 and folate levels normal.  The propranolol was very helpful for her anxiety.  It also controlled the palpitations. Try it before naps to see if it prevents NM. Consider doxazosin.  We discussed the short-term risks associated with benzodiazepines including sedation and increased fall risk among  others.  Discussed long-term side effect risk including dependence, potential withdrawal symptoms, and the potential eventual dose-related risk of dementia.  Xanax helped a lot with normal and social anxiety. Continue Xanax and propranolol prn.  Patient needs to sleep in order to control the hypomania.  Limit Xanax DT fatigue. Only taking 0.25 mg daily.  Disc stopping it.  Supportive therapy on managing the recent stressors and disc recent LOA from work for mental health reasons.  Was helpful.  Disc possible return to in office from work at home issues. Disc getting D psych help.  Get more sleep  Switch Wellbutrin to Auvelity 1 in the AM for 2 weeks and if no response then  increase to twice daily for initiative and motivaltion.  modafinil 200 to 400 mg AM  FU 2  mos.  Meredith Staggers, MD, DFAPA     Please see After Visit Summary for patient specific instructions.  Future Appointments  Date Time Provider Department Center  05/27/2023  9:30 AM Cottle, Steva Ready., MD [facility] None     No orders of the defined types were placed in this encounter.      -------------------------------

## 2023-01-29 NOTE — Telephone Encounter (Signed)
Prior authorization not needed.

## 2023-02-24 ENCOUNTER — Other Ambulatory Visit: Payer: Self-pay | Admitting: Psychiatry

## 2023-02-24 DIAGNOSIS — F411 Generalized anxiety disorder: Secondary | ICD-10-CM

## 2023-02-24 DIAGNOSIS — F4001 Agoraphobia with panic disorder: Secondary | ICD-10-CM

## 2023-02-24 DIAGNOSIS — F4310 Post-traumatic stress disorder, unspecified: Secondary | ICD-10-CM

## 2023-03-04 ENCOUNTER — Other Ambulatory Visit: Payer: Self-pay | Admitting: Psychiatry

## 2023-03-04 DIAGNOSIS — F3181 Bipolar II disorder: Secondary | ICD-10-CM

## 2023-03-11 ENCOUNTER — Other Ambulatory Visit: Payer: Self-pay | Admitting: Psychiatry

## 2023-03-11 DIAGNOSIS — F3181 Bipolar II disorder: Secondary | ICD-10-CM

## 2023-03-11 DIAGNOSIS — R5383 Other fatigue: Secondary | ICD-10-CM

## 2023-03-13 ENCOUNTER — Telehealth: Payer: Self-pay | Admitting: Psychiatry

## 2023-03-13 MED ORDER — AUVELITY 45-105 MG PO TBCR: 1.0000 | EXTENDED_RELEASE_TABLET | Freq: Two times a day (BID) | ORAL | 1 refills | Status: DC

## 2023-03-13 NOTE — Telephone Encounter (Signed)
Patient still wants Rx sent to [facility] and not [facility] or [facility].

## 2023-03-13 NOTE — Telephone Encounter (Signed)
Pt called and said that she has been on the auvelity samples  45-105. She is taking one a day. This medicine is working extremely well. She would [facility] [address] In [place]. She needs a PA started. She has the coupon card as well. She has half a bottle as well left but will take more samples while waiting for the PA to be approved. Her number is [phone]

## 2023-03-13 NOTE — Telephone Encounter (Signed)
Rx sent 

## 2023-03-24 ENCOUNTER — Telehealth: Payer: Self-pay | Admitting: Psychiatry

## 2023-03-24 NOTE — Telephone Encounter (Signed)
CMM sent PA Request for Auvelity45-105mg , [facility] key# [id number]

## 2023-03-27 NOTE — Telephone Encounter (Signed)
PA pending with [place]

## 2023-04-06 ENCOUNTER — Other Ambulatory Visit: Payer: Self-pay | Admitting: Psychiatry

## 2023-04-06 DIAGNOSIS — F3181 Bipolar II disorder: Secondary | ICD-10-CM

## 2023-04-07 NOTE — Telephone Encounter (Signed)
Stopped Wellbutrin to switch to [employer]

## 2023-05-11 ENCOUNTER — Telehealth: Payer: Self-pay

## 2023-05-11 NOTE — Telephone Encounter (Signed)
Prior Authorization resubmitted for Auvelity 45-105 mg with [employer] with an approval, CaseId:[id number];05/11/2023-05/10/2024.

## 2023-05-11 NOTE — Telephone Encounter (Signed)
Resubmitted with an approval for [employer]

## 2023-05-27 ENCOUNTER — Ambulatory Visit: Payer: 59 | Admitting: Psychiatry

## 2023-06-02 ENCOUNTER — Encounter: Payer: Self-pay | Admitting: Psychiatry

## 2023-06-02 ENCOUNTER — Ambulatory Visit (INDEPENDENT_AMBULATORY_CARE_PROVIDER_SITE_OTHER): Payer: 59 | Admitting: Psychiatry

## 2023-06-02 DIAGNOSIS — R5383 Other fatigue: Secondary | ICD-10-CM | POA: Diagnosis not present

## 2023-06-02 DIAGNOSIS — F411 Generalized anxiety disorder: Secondary | ICD-10-CM

## 2023-06-02 DIAGNOSIS — F4310 Post-traumatic stress disorder, unspecified: Secondary | ICD-10-CM

## 2023-06-02 DIAGNOSIS — F4001 Agoraphobia with panic disorder: Secondary | ICD-10-CM

## 2023-06-02 DIAGNOSIS — F5105 Insomnia due to other mental disorder: Secondary | ICD-10-CM | POA: Diagnosis not present

## 2023-06-02 DIAGNOSIS — F3181 Bipolar II disorder: Secondary | ICD-10-CM

## 2023-06-02 MED ORDER — AUVELITY 45-105 MG PO TBCR: 1.0000 | EXTENDED_RELEASE_TABLET | Freq: Two times a day (BID) | ORAL | 1 refills | Status: DC

## 2023-06-02 MED ORDER — MODAFINIL 200 MG PO TABS
ORAL_TABLET | ORAL | 2 refills | Status: DC
Start: 2023-06-02 — End: 2023-09-01

## 2023-06-02 MED ORDER — LAMOTRIGINE 150 MG PO TABS: ORAL_TABLET | ORAL | 0 refills | Status: DC

## 2023-06-02 NOTE — Progress Notes (Signed)
Lindsey [name] [id number] Mar 24, 1976 47 y.o.   Subjective:   Patient ID:  Lindsey [name] is a 47 y.o. (DOB 12-20-1975) female.  Chief Complaint:  No chief complaint on file.   Depression        Associated symptoms include no decreased concentration, no fatigue and no suicidal ideas.  Past medical history includes anxiety.   Anxiety Patient reports no confusion, decreased concentration, nervous/anxious behavior, palpitations or suicidal ideas.      Lindsey [name] presents to today for follow-up of mood and anxiety.  When seen April 27th, 47  She was hypomanic at the time and having insomnia.  We increased Xanax 1 mg nightly and she was encouraged to increase oxcarbazepine to 600 mg nightly to help with the hypomania.  She is fairly sensitive to mood stabilizers and has failed several.  She returned in June reporting hypomania resolved.  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  Finally ended the trauma bond and is better.  Trileptal and Xanax at night is better but only taking 1/2 of 300mg  Trileptal bc sleepiness from 300 mg hs.  She's taking Xanax 0.5 mg AM and HS.  Getting a lot done.  Problematic boss got fired for performance problems.  No meds were changed at the June visit.  seen October 2020 the following was noted:She is now hypomanic with no particular trigger.  This may be associated with a tendency toward spring hypomania and bipolar patients. Chronic compliance problems with addition of meds.  Rec restart Trileptal 150 mg HS. This is off label for bipolar mania or hypomania.  She is fairly sensitive to mood stabilizers historically.  She does not want weight gain.   November 17, 2019 appointment, the following is noted: Been working on self growth and taking advantages of resources on internet and self esteem.  Pleased with meds. Not taken Trileptal.   Wonders if she has borderline pd bc splitting and black and white thinking.  Mood swings can be rapid  from 30 min to 3 days.  Also triggered by abandonment. Chronic struggles with intrusive mother who she feels triggers her. Panic triggered only with one person.  Social anxiety is less. Working from home helps. Patient reports stable mood and denies depressed or irritable moods.    Patient denies difficulty with sleep initiation or maintenance. Denies appetite disturbance.  Patient reports that energy and motivation have been good.  Patient denies any difficulty with concentration.  Patient denies any suicidal ideation. Plan no med changes  03/15/20 appt. With the following noted:   Wrote her out of work for 3 weeks DT multiple stressors with "mental health overload" including father of child facing jail then mistake at work.  Intense sensitivity to failure.  Couldn't eat and lost 13# in 3 weeks.  Needed break from work to manage stress.  Break was very helpful at restoration. Now doing OK.   Working from home has been good for her.  May ask for excuse to work part of the time from home. Started B12 and D3 which helped energy.  Not overly depressed.  No mood swings.  Incredible fear of abandonment also flared up in rel with current BF. Patient reports stable mood and denies depressed or irritable moods.   Patient denies difficulty with sleep initiation or maintenance.  NM if naps.  Denies appetite disturbance.  Patient reports that energy and motivation have been good.  Patient denies any difficulty with concentration.  Patient denies  any suicidal ideation. Plan: No med changes  11/19/2020 appointment with the following noted: Doing OK.  Meds working fine.  Doing Ok spiritually. Concern is so fatigued.  Not sure the cause.  Wonders about vitamin deficiency.  Naps when she can.  Not like a depressed fatigue. Sleep is OK.  Average 5 hours nightly during the week.  Partly bc it's quiet. In this pattern for a month or so. Big project at work requiring all attention and effort during the day. 20%  raise. Drinks caffeine  All day and has for years.  03/27/2021 appointment with the following noted: Doing alright.  Still work stress.  Company bought out.  So expects to get laid off.   Takes Xanax 0.5 mg AM and then 0.25 prn about 2-3 times per month.  Reaches for propranolol first.  Inderall helped anxiety a whole lot. Stress M recently psychotic and hospitalized and has Poor coping and severe fear of abandoment.. Pt is inconsistent and maybe PMS related and may nap 2-3 hours.  May sleep to shut off the world for a little. Overall is doing OK and at baseline.  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   Still on lamotrigine 150 AM and 300 HS. No SE. Plan: Be careful with caffeine bc anxiety. No med changes indicated  08/15/2021 appointment with the following noted: Situational depression.  Got a raise and not laid off.   Position with corporate team and permanent now.  Will be different after 18 years at [employer].   Still on lamotrigine 150 AM and 300 HS.. Stable depression and working on herself.  Getting better. Want to get stuff done around the house but hard to make herself do it. Sleep pattern terrible with naps and split schedule but getting 8 hours of sleep.  Likes staying up late to have quiet.   D will be 13 in May.  Hates [employer] and declining grades.    Needs transfer.   No concerns about meds. Bad panic in November with trigger of seeing someone with residual sx for a week.  Worst in a long time.  Was trauma trigger. Plan: No med changes  12/18/2021 appointment with the following noted: Doing good.  A lot of stress since here and managed well.  Worked on attachment issues in therapy.   Changed Xnanx to 0.5 mg 1/2 BID and it's helped to split it out.  Helped anxiety to do that. Taking [name]'s father to court for support and custody. [name] a lot of mental health problems including anxiety. Working on losing weight counting  calories. Lost 18#. Work from home FT good. Patient reports stable mood and denies depressed or irritable moods.   Patient denies difficulty with sleep initiation or maintenance. Denies appetite disturbance.  Patient reports that energy and motivation have been good.  Patient denies any difficulty with concentration.  Patient denies any suicidal ideation.  04/21/22 appt noted" Summer stress with anxierty overwhelmed.  Not managed with Xanax and propranolol.  RX clonidine off label.  Talked with friend and never tried it bc it got better. Anxity pretty good now.  Was taking propranolol 30 mg daily and now 20 mg daily. Some cycling of fatigue and napping.  A little irritability.  No mania. Tolerating meds. Xanax 0.25 mg AM.  Only. Sleep schedule not always regular. Positives work raise.   Tori into charter school and pleased with change.  08/26/22 appt noted: Something is  wrong. Mid Dec had to take 2 days off work bc depressed and burned out.  Cannot seem to pull up out of it.  Not sad but executive function freeze with amotivation.  Trouble with focus.  Can't pull herself out of it.  Enjoyment 4/10.  Affecting work.  Not much anxiety.  Sleep pattern erratic with average 5 hours and hard to make herself go to bed bc night is her free time.  Normal 7-8 hours.  Some naps. Plan: Wellbutrin trial  10/28/22 appt noted: Got up to 300 XL wellbutrin with night sweats that eventually stopped.  Mixed feels about it.  SE Problems getting words out.  Having wt gain.  Didn't improve energy or motivation.   Smoking comes and goes but stopped smoking pot with it.  And has a negative feeling on pot now.  None for a month.   Not jittery or edgy now and was jaw clenching but resolved. No sig sadness.    Burnt a bridge with someone that was necessary.  Released some anger.   Getting enough sleep. Plan: Yes modafinil 100 to 200 mg AM  01/27/23 appt noted: Increased modafinil 400 mg AM.  No SE.  She thinks it helped  to increase it  Continues Wellbutrin XL 150 AM, lamotrigine 150 AM and 300 PM. Has desire to do things but poor inititative and productivity.  Doing bare minimum.  Not sad.  BR is reflective of mental health but it is a mess.   Not tired. No swings. Plan: Switch Wellbutrin to Auvelity 1 in the AM for 2 weeks and if no response then increase to twice daily for initiative and motivaltion. modafinil 200 to 400 mg AM  06/02/23 appt noted: Psych med: Auvelity AM, no Wellbutrin, lamotrigine 150 AM and 300 PM, modafinil 200 mg am and noon, propranolol 10 BID prn anxiety. No SE and within 3-4 days noticed benefit with Auvelity with mood.   No Xanax needed lately.  For over a month. Propranolol prn anticipating meetings.  Still burned out from work.  Falls over in taking care of the house.  But otherwise not sad.  Not as worried.   Satisfied.   Needs to be better caring for he room and home.  Making some progress.   Lost 40# on Zepbound.  Lowest wt as an adult and 100#.    Xanax more calming than the others.  Lindsey [name] is 37 yo.  Her father is alcoholic.  Last hypomania was November 2018 and then again April 2018.  Past Psychiatric Medication Trials: Poor response SSRIs,  Effexor, Paxil, sertraline itchy, Wellbutrin, Serzone, Strattera nausea,  Vraylar, olanzapine sedation, Latuda fogginess, risperidone, Abilify, Trileptal 300 tired, lamotrigine,  lithium, propranolol,    Klonopin, Ativan, Xanax, buspirone nausea NAC,   Review of Systems:  Review of Systems  Constitutional:  Negative for fatigue.  Cardiovascular:  Negative for palpitations.  Neurological:  Negative for tremors.  Psychiatric/Behavioral:  Negative for agitation, behavioral problems, confusion, decreased concentration, dysphoric mood, hallucinations, self-injury, sleep disturbance and suicidal ideas. The patient is not nervous/anxious and is not hyperactive.     Medications: I have reviewed the patient's current  medications.  Current Outpatient Medications  Medication Sig Dispense Refill   acetaminophen-codeine (TYLENOL #3) 300-30 MG tablet Take 1 tablet by mouth every 8 (eight) hours as needed.     ALPRAZolam (XANAX) 0.25 MG tablet Take 1 tablet (0.25 mg total) by mouth every 8 (eight) hours as needed. 90 tablet 0   buPROPion (  WELLBUTRIN XL) 150 MG 24 hr tablet TAKE 1 TABLET BY MOUTH EVERY DAY 90 tablet 0   Cholecalciferol (VITAMIN D3) 25 MCG (1000 UT) CAPS Take by mouth.     Cyanocobalamin (VITAMIN B12) 1000 MCG TBCR 1 tablet     Dextromethorphan-buPROPion ER (AUVELITY) 45-105 MG TBCR Take 1 tablet by mouth 2 (two) times daily. 60 tablet 1   lamoTRIgine (LAMICTAL) 150 MG tablet Take 1 tablet by mouth every morning and take 2 tablets by mouth every evening. 270 tablet 0   levonorgestrel (MIRENA) 20 MCG/24HR IUD 1 each by Intrauterine route once.     modafinil (PROVIGIL) 200 MG tablet TAKE 1 TABLET BY MOUTH IN THE MORNING AND AT NOON 60 tablet 2   propranolol (INDERAL) 10 MG tablet Take 1 tablet by mouth three times daily as needed. 270 tablet 0   triamcinolone ointment (KENALOG) 0.1 % Apply 1 application. topically 2 (two) times daily as needed (Rash). 454 g 11   No current facility-administered medications for this visit.    Medication Side Effects: None  Allergies:  Allergies  Allergen Reactions   Celexa [Citalopram]     Other reaction(s): tremor    Past Medical History:  Diagnosis Date   Other and unspecified disc disorder of lumbar region    torn lumbar disc?    Family History  Problem Relation Age of Onset   Diabetes Mother    Diabetes Father    Cancer Father        kidney   Cancer Paternal Grandfather        PROSTATE    Social History   Socioeconomic History   Marital status: Single    Spouse name: Not on file   Number of children: Not on file   Years of education: Not on file   Highest education level: Not on file  Occupational History   Not on file  Tobacco Use    Smoking status: Every Day    Current packs/day: 0.75    Types: Cigarettes   Smokeless tobacco: Never  Vaping Use   Vaping status: Never Used  Substance and Sexual Activity   Alcohol use: No   Drug use: No   Sexual activity: Yes    Birth control/protection: I.U.D., Condom    Comment: 1st intercourse- 24, partners- 5, mirena inserted 04-16-17  Other Topics Concern   Not on file  Social History Narrative   Not on file   Social Determinants of Health   Financial Resource Strain: Not on file  Food Insecurity: Not on file  Transportation Needs: Not on file  Physical Activity: Not on file  Stress: Not on file  Social Connections: Not on file  Intimate Partner Violence: Not on file    Past Medical History, Surgical history, Social history, and Family history were reviewed and updated as appropriate.   Please see review of systems for further details on the patient's review from today.   Objective:   Physical Exam:  There were no vitals taken for this visit.  Physical Exam Constitutional:      Appearance: She is obese.  Musculoskeletal:        General: No deformity.  Neurological:     Mental Status: She is alert.     Cranial Nerves: No dysarthria.     Motor: No weakness or tremor.     Gait: Gait normal.  Psychiatric:        Attention and Perception: Attention and perception normal.  Mood and Affect: Mood is not anxious or depressed.        Speech: Speech is not rapid and pressured, slurred or tangential.        Behavior: Behavior normal. Behavior is cooperative.        Thought Content: Thought content normal. Thought content is not delusional. Thought content does not include suicidal ideation.        Cognition and Memory: Cognition and memory normal. Cognition is not impaired. She does not exhibit impaired recent memory.        Judgment: Judgment normal.     Comments: Good mood stability overall Better mood and interests with Auvelity       Lab Review:      Component Value Date/Time   NA 139 04/10/2021 1324   K 4.0 04/10/2021 1324   CL 106 04/10/2021 1324   CO2 27 04/10/2021 1324   GLUCOSE 88 04/10/2021 1324   BUN 9 04/10/2021 1324   CREATININE 0.70 04/10/2021 1324   CALCIUM 9.1 04/10/2021 1324   PROT 6.4 04/10/2021 1324   ALBUMIN 4.2 08/18/2016 0838   AST 14 04/10/2021 1324   ALT 20 04/10/2021 1324   ALKPHOS 65 08/18/2016 0838   BILITOT 0.4 04/10/2021 1324       Component Value Date/Time   WBC 8.2 04/10/2021 1324   RBC 4.36 04/10/2021 1324   HGB 14.3 04/10/2021 1324   HCT 41.5 04/10/2021 1324   PLT 266 04/10/2021 1324   MCV 95.2 04/10/2021 1324   MCH 32.8 04/10/2021 1324   MCHC 34.5 04/10/2021 1324   RDW 12.0 04/10/2021 1324   LYMPHSABS 2,763 04/10/2021 1324   MONOABS 292 08/18/2016 0838   EOSABS 607 (H) 04/10/2021 1324   BASOSABS 74 04/10/2021 1324    No results found for: "POCLITH", "LITHIUM"   No results found for: "PHENYTOIN", "PHENOBARB", "VALPROATE", "CBMZ"   .res Assessment: Plan:    No diagnosis found.  Medsensitive.  30 min session   Lindsey [name] has a long history of bipolar  2 disorder.  Usually manic episodes are mild.  Her mother also has bipolar disorder.  She has failed numerous mood stabilizers as noted above.     Prefers sheild-shaped lamotrigine and oval alprazolam vs round of each in terms of effectivenss.  Asked her to get the generic manufacturer names for these and we can request them.  Disc differences with generics.  Sleep deprived during the week and too much caffeine.  Disc sleep hygiene.  Stop caffeine at 3 instead of 7 PM.  This may be the cause of the insomnia during the week.  B12 and folate levels normal.  The propranolol was very helpful for her anxiety.  It also controlled the palpitations. Try it before naps to see if it prevents NM.  We discussed the short-term risks associated with benzodiazepines including sedation and increased fall risk among others.  Discussed long-term side  effect risk including dependence, potential withdrawal symptoms, and the potential eventual dose-related risk of dementia.  Xanax helped a lot with normal and social anxiety. Continue Xanax and propranolol prn.  Patient needs to sleep in order to control the hypomania.  Limit Xanax DT fatigue. Only taking 0.25 mg daily.  Disc stopping it.  Supportive therapy on managing the recent stressors and disc recent LOA from work for mental health reasons.  Was helpful.  Disc possible return to in office from work at home issues. Disc getting D psych help.  Get more sleep  Auvelity 1 in  the AM markedly helped mood  modafinil 200 to 400 mg AM  Option reduce lamotrigine over a couple of mos to 150 BID  FU 4  mos.  Meredith Staggers, MD, DFAPA     Please see After Visit Summary for patient specific instructions.  Future Appointments  Date Time Provider Department Center  06/02/2023  4:00 PM Cottle, Steva Ready., MD [facility] None  07/29/2023  9:30 AM Cottle, Steva Ready., MD [facility] None     No orders of the defined types were placed in this encounter.      -------------------------------

## 2023-07-29 ENCOUNTER — Ambulatory Visit: Payer: 59 | Admitting: Psychiatry

## 2023-09-01 ENCOUNTER — Ambulatory Visit: Payer: 59 | Admitting: Psychiatry

## 2023-09-01 ENCOUNTER — Encounter: Payer: Self-pay | Admitting: Psychiatry

## 2023-09-01 DIAGNOSIS — F411 Generalized anxiety disorder: Secondary | ICD-10-CM | POA: Diagnosis not present

## 2023-09-01 DIAGNOSIS — F4001 Agoraphobia with panic disorder: Secondary | ICD-10-CM

## 2023-09-01 DIAGNOSIS — F5105 Insomnia due to other mental disorder: Secondary | ICD-10-CM

## 2023-09-01 DIAGNOSIS — F3181 Bipolar II disorder: Secondary | ICD-10-CM | POA: Diagnosis not present

## 2023-09-01 DIAGNOSIS — F4310 Post-traumatic stress disorder, unspecified: Secondary | ICD-10-CM

## 2023-09-01 DIAGNOSIS — R5383 Other fatigue: Secondary | ICD-10-CM

## 2023-09-01 MED ORDER — MODAFINIL 200 MG PO TABS: ORAL_TABLET | ORAL | 3 refills | Status: DC

## 2023-09-01 MED ORDER — PROPRANOLOL HCL 10 MG PO TABS: 10.0000 mg | ORAL_TABLET | Freq: Three times a day (TID) | ORAL | 1 refills | Status: AC | PRN

## 2023-09-01 MED ORDER — AUVELITY 45-105 MG PO TBCR: 1.0000 | EXTENDED_RELEASE_TABLET | Freq: Two times a day (BID) | ORAL | 3 refills | Status: DC

## 2023-09-01 MED ORDER — ALPRAZOLAM 0.25 MG PO TABS: 0.2500 mg | ORAL_TABLET | Freq: Three times a day (TID) | ORAL | 0 refills | Status: AC | PRN

## 2023-09-01 MED ORDER — LAMOTRIGINE 150 MG PO TABS
150.0000 mg | ORAL_TABLET | Freq: Two times a day (BID) | ORAL | 1 refills | Status: DC
Start: 2023-09-01 — End: 2024-02-12

## 2023-09-01 NOTE — Progress Notes (Signed)
 [name] [id number] [birth year] 48 y.o.   Subjective:   Patient ID:  Lindsey [name] is a 47 y.o. (DOB 06-26-1976) female.  Chief Complaint:  Chief Complaint  Patient presents with   Follow-up    Mood and anxiety    LARAE CAISON presents to today for follow-up of mood and anxiety.  When seen April 27th, 2020.  She was hypomanic at the time and having insomnia.  We increased Xanax 1 mg nightly and she was encouraged to increase oxcarbazepine to 600 mg nightly to help with the hypomania.  She is fairly sensitive to mood stabilizers and has failed several.  She returned in June reporting hypomania resolved.  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  Finally ended the trauma bond and is better.  Trileptal and Xanax at night is better but only taking 1/2 of 300mg  Trileptal bc sleepiness from 300 mg hs.  She's taking Xanax 0.5 mg AM and HS.  Getting a lot done.  Problematic boss got fired for performance problems.  No meds were changed at the June visit.  seen October 2020 the following was noted:She is now hypomanic with no particular trigger.  This may be associated with a tendency toward spring hypomania and bipolar patients. Chronic compliance problems with addition of meds.  Rec restart Trileptal 150 mg HS. This is off label for bipolar mania or hypomania.  She is fairly sensitive to mood stabilizers historically.  She does not want weight gain.   November 17, 2019 appointment, the following is noted: Been working on self growth and taking advantages of resources on internet and self esteem.  Pleased with meds. Not taken Trileptal.   Wonders if she has borderline pd bc splitting and black and white thinking.  Mood swings can be rapid from 30 min to 3 days.  Also triggered by abandonment. Chronic struggles with intrusive mother who she feels triggers her. Panic triggered only with one person.  Social anxiety is less. Working from home helps. Patient reports  stable mood and denies depressed or irritable moods.    Patient denies difficulty with sleep initiation or maintenance. Denies appetite disturbance.  Patient reports that energy and motivation have been good.  Patient denies any difficulty with concentration.  Patient denies any suicidal ideation. Plan no med changes  03/15/20 appt. With the following noted:   Wrote her out of work for 3 weeks DT multiple stressors with "mental health overload" including father of child facing jail then mistake at work.  Intense sensitivity to failure.  Couldn't eat and lost 13# in 3 weeks.  Needed break from work to manage stress.  Break was very helpful at restoration. Now doing OK.   Working from home has been good for her.  May ask for excuse to work part of the time from home. Started B12 and D3 which helped energy.  Not overly depressed.  No mood swings.  Incredible fear of abandonment also flared up in rel with current BF. Patient reports stable mood and denies depressed or irritable moods.   Patient denies difficulty with sleep initiation or maintenance.  NM if naps.  Denies appetite disturbance.  Patient reports that energy and motivation have been good.  Patient denies any difficulty with concentration.  Patient denies any suicidal ideation. Plan: No med changes  11/19/2020 appointment with the following noted: Doing OK.  Meds working fine.  Doing Ok spiritually. Concern is so fatigued.  Not sure the cause.  Wonders  about vitamin deficiency.  Naps when she can.  Not like a depressed fatigue. Sleep is OK.  Average 5 hours nightly during the week.  Partly bc it's quiet. In this pattern for a month or so. Big project at work requiring all attention and effort during the day. 20% raise. Drinks caffeine  All day and has for years.  03/27/2021 appointment with the following noted: Doing alright.  Still work stress.  Company bought out.  So expects to get laid off.   Takes Xanax 0.5 mg AM and then 0.25 prn about  2-3 times per month.  Reaches for propranolol first.  Inderall helped anxiety a whole lot. Stress M recently psychotic and hospitalized and has Poor coping and severe fear of abandoment.. Pt is inconsistent and maybe PMS related and may nap 2-3 hours.  May sleep to shut off the world for a little. Overall is doing OK and at baseline.  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   Still on lamotrigine 150 AM and 300 HS. No SE. Plan: Be careful with caffeine bc anxiety. No med changes indicated  08/15/2021 appointment with the following noted: Situational depression.  Got a raise and not laid off.   Position with corporate team and permanent now.  Will be different after 18 years at [employer].   Still on lamotrigine 150 AM and 300 HS.. Stable depression and working on herself.  Getting better. Want to get stuff done around the house but hard to make herself do it. Sleep pattern terrible with naps and split schedule but getting 8 hours of sleep.  Likes staying up late to have quiet.   D will be 13 in May.  Hates [employer] and declining grades.    Needs transfer.   No concerns about meds. Bad panic in November with trigger of seeing someone with residual sx for a week.  Worst in a long time.  Was trauma trigger. Plan: No med changes  12/18/2021 appointment with the following noted: Doing good.  A lot of stress since here and managed well.  Worked on attachment issues in therapy.   Changed Xnanx to 0.5 mg 1/2 BID and it's helped to split it out.  Helped anxiety to do that. Taking [name]'s father to court for support and custody. [name] a lot of mental health problems including anxiety. Working on losing weight counting calories. Lost 18#. Work from home FT good. Patient reports stable mood and denies depressed or irritable moods.   Patient denies difficulty with sleep initiation or maintenance. Denies appetite disturbance.  Patient reports that  energy and motivation have been good.  Patient denies any difficulty with concentration.  Patient denies any suicidal ideation.  04/21/22 appt noted" Summer stress with anxierty overwhelmed.  Not managed with Xanax and propranolol.  RX clonidine off label.  Talked with friend and never tried it bc it got better. Anxity pretty good now.  Was taking propranolol 30 mg daily and now 20 mg daily. Some cycling of fatigue and napping.  A little irritability.  No mania. Tolerating meds. Xanax 0.25 mg AM.  Only. Sleep schedule not always regular. Positives work raise.   Tori into charter school and pleased with change.  08/26/22 appt noted: Something is wrong. Mid Dec had to take 2 days off work bc depressed and burned out.  Cannot seem to pull up out of it.  Not sad but executive function freeze with amotivation.  Trouble  with focus.  Can't pull herself out of it.  Enjoyment 4/10.  Affecting work.  Not much anxiety.  Sleep pattern erratic with average 5 hours and hard to make herself go to bed bc night is her free time.  Normal 7-8 hours.  Some naps. Plan: Wellbutrin trial  10/28/22 appt noted: Got up to 300 XL wellbutrin with night sweats that eventually stopped.  Mixed feels about it.  SE Problems getting words out.  Having wt gain.  Didn't improve energy or motivation.   Smoking comes and goes but stopped smoking pot with it.  And has a negative feeling on pot now.  None for a month.   Not jittery or edgy now and was jaw clenching but resolved. No sig sadness.    Burnt a bridge with someone that was necessary.  Released some anger.   Getting enough sleep. Plan: Yes modafinil 100 to 200 mg AM  01/27/23 appt noted: Increased modafinil 400 mg AM.  No SE.  She thinks it helped to increase it  Continues Wellbutrin XL 150 AM, lamotrigine 150 AM and 300 PM. Has desire to do things but poor inititative and productivity.  Doing bare minimum.  Not sad.  BR is reflective of mental health but it is a mess.    Not tired. No swings. Plan: Switch Wellbutrin to Auvelity 1 in the AM for 2 weeks and if no response then increase to twice daily for initiative and motivaltion. modafinil 200 to 400 mg AM  06/02/23 appt noted: Psych med: Auvelity AM, no Wellbutrin, lamotrigine 150 AM and 300 PM, modafinil 200 mg am and noon, propranolol 10 BID prn anxiety. No SE and within 3-4 days noticed benefit with Auvelity with mood.   No Xanax needed lately.  For over a month. Propranolol prn anticipating meetings.  Still burned out from work.  Falls over in taking care of the house.  But otherwise not sad.  Not as worried.   Satisfied.   Needs to be better caring for he room and home.  Making some progress.   Lost 40# on Zepbound.  Lowest wt as an adult and 100#.    09/01/23 appt noted: Psych med: Auvelity 1 in the AM, lamotrigine 150 AM and 150 PM, modafinil 200 mg am and noon, propranolol 10 BID prn anxiety, added B12 2000 mcg level and helped energy. Reduced lamotrigine without a problem so far but gets a little HA initially.   Added B12 helped energy noticeably.   Still some lack of motivation but not terrible. Work is going ok. Back MRI today.    Xanax more calming than the others.  [name] is 54 yo.  Her father is alcoholic.  Last hypomania was November 2018 and then again April 2018.  Past Psychiatric Medication Trials: Poor response SSRIs,  Effexor, Paxil, sertraline itchy, Wellbutrin, Serzone, Strattera nausea,  Vraylar, olanzapine sedation, Latuda fogginess, risperidone, Abilify, Trileptal 300 tired, lamotrigine,  lithium, propranolol,    Klonopin, Ativan, Xanax, buspirone nausea NAC,   Review of Systems:  Review of Systems  Constitutional:  Negative for fatigue.  Cardiovascular:  Negative for palpitations.  Neurological:  Negative for tremors.  Psychiatric/Behavioral:  Negative for agitation, behavioral problems, confusion, decreased concentration, dysphoric mood, hallucinations, self-injury,  sleep disturbance and suicidal ideas. The patient is not nervous/anxious and is not hyperactive.     Medications: I have reviewed the patient's current medications.  Current Outpatient Medications  Medication Sig Dispense Refill   acetaminophen-codeine (TYLENOL #3) 300-30 MG tablet  Take 1 tablet by mouth every 8 (eight) hours as needed.     ALPRAZolam (XANAX) 0.25 MG tablet Take 1 tablet (0.25 mg total) by mouth every 8 (eight) hours as needed. 90 tablet 0   Cholecalciferol (VITAMIN D3) 25 MCG (1000 UT) CAPS Take by mouth.     Cyanocobalamin (VITAMIN B12) 1000 MCG TBCR 1 tablet     Dextromethorphan-buPROPion ER (AUVELITY) 45-105 MG TBCR Take 1 tablet by mouth 2 (two) times daily. (Patient taking differently: Take 1 tablet by mouth 2 (two) times daily. Taking 1 in the AM) 60 tablet 1   lamoTRIgine (LAMICTAL) 150 MG tablet Take 1 tablet by mouth every morning and take 2 tablets by mouth every evening. (Patient taking differently: Take 150 mg by mouth 2 (two) times daily. Take 1 tablet by mouth every morning and take 2 tablets by mouth every evening.) 270 tablet 0   levonorgestrel (MIRENA) 20 MCG/24HR IUD 1 each by Intrauterine route once.     modafinil (PROVIGIL) 200 MG tablet TAKE 1 TABLET BY MOUTH IN THE MORNING AND AT NOON 60 tablet 2   propranolol (INDERAL) 10 MG tablet Take 1 tablet by mouth three times daily as needed. 270 tablet 0   triamcinolone ointment (KENALOG) 0.1 % Apply 1 application. topically 2 (two) times daily as needed (Rash). 454 g 11   ZEPBOUND 12.5 MG/0.5ML Pen Inject 12.5 mg into the skin once a week.     No current facility-administered medications for this visit.    Medication Side Effects: None  Allergies:  Allergies  Allergen Reactions   Celexa [Citalopram]     Other reaction(s): tremor    Past Medical History:  Diagnosis Date   Other and unspecified disc disorder of lumbar region    torn lumbar disc?    Family History  Problem Relation Age of Onset    Diabetes Mother    Diabetes Father    Cancer Father        kidney   Cancer Paternal Grandfather        PROSTATE    Social History   Socioeconomic History   Marital status: Single    Spouse name: Not on file   Number of children: Not on file   Years of education: Not on file   Highest education level: Not on file  Occupational History   Not on file  Tobacco Use   Smoking status: Every Day    Current packs/day: 0.75    Types: Cigarettes   Smokeless tobacco: Never  Vaping Use   Vaping status: Never Used  Substance and Sexual Activity   Alcohol use: No   Drug use: No   Sexual activity: Yes    Birth control/protection: I.U.D., Condom    Comment: 1st intercourse- 24, partners- 5, mirena inserted 04-16-17  Other Topics Concern   Not on file  Social History Narrative   Not on file   Social Drivers of Health   Financial Resource Strain: Not on file  Food Insecurity: Not on file  Transportation Needs: Not on file  Physical Activity: Not on file  Stress: Not on file  Social Connections: Not on file  Intimate Partner Violence: Not on file    Past Medical History, Surgical history, Social history, and Family history were reviewed and updated as appropriate.   Please see review of systems for further details on the patient's review from today.   Objective:   Physical Exam:  There were no vitals taken for this visit.  Physical Exam Constitutional:      Appearance: She is obese.  Musculoskeletal:        General: No deformity.  Neurological:     Mental Status: She is alert.     Cranial Nerves: No dysarthria.     Motor: No weakness or tremor.     Gait: Gait normal.  Psychiatric:        Attention and Perception: Attention and perception normal.        Mood and Affect: Mood is not anxious or depressed.        Speech: Speech is not rapid and pressured, slurred or tangential.        Behavior: Behavior normal. Behavior is cooperative.        Thought Content: Thought  content normal. Thought content is not delusional. Thought content does not include suicidal ideation.        Cognition and Memory: Cognition and memory normal. Cognition is not impaired. She does not exhibit impaired recent memory.        Judgment: Judgment normal.     Comments: Good mood stability overall Better mood and interests with Auvelity       Lab Review:     Component Value Date/Time   NA 139 04/10/2021 1324   K 4.0 04/10/2021 1324   CL 106 04/10/2021 1324   CO2 27 04/10/2021 1324   GLUCOSE 88 04/10/2021 1324   BUN 9 04/10/2021 1324   CREATININE 0.70 04/10/2021 1324   CALCIUM 9.1 04/10/2021 1324   PROT 6.4 04/10/2021 1324   ALBUMIN 4.2 08/18/2016 0838   AST 14 04/10/2021 1324   ALT 20 04/10/2021 1324   ALKPHOS 65 08/18/2016 0838   BILITOT 0.4 04/10/2021 1324       Component Value Date/Time   WBC 8.2 04/10/2021 1324   RBC 4.36 04/10/2021 1324   HGB 14.3 04/10/2021 1324   HCT 41.5 04/10/2021 1324   PLT 266 04/10/2021 1324   MCV 95.2 04/10/2021 1324   MCH 32.8 04/10/2021 1324   MCHC 34.5 04/10/2021 1324   RDW 12.0 04/10/2021 1324   LYMPHSABS 2,763 04/10/2021 1324   MONOABS 292 08/18/2016 0838   EOSABS 607 (H) 04/10/2021 1324   BASOSABS 74 04/10/2021 1324    No results found for: "POCLITH", "LITHIUM"   No results found for: "PHENYTOIN", "PHENOBARB", "VALPROATE", "CBMZ"   .res Assessment: Plan:    Bipolar II disorder (HCC)  Generalized anxiety disorder  Insomnia due to mental condition  Panic disorder with agoraphobia  PTSD (post-traumatic stress disorder)  Fatigue, unspecified type  Medsensitive.  30 min session   Jasminemarie has a long history of bipolar  2 disorder.  Usually manic episodes are mild.  Her mother also has bipolar disorder.  She has failed numerous mood stabilizers as noted above.     Prefers sheild-shaped lamotrigine and oval alprazolam vs round of each in terms of effectivenss.  Asked her to get the generic manufacturer names for  these and we can request them.  Disc differences with generics.  B12 and folate levels normal 03/05/2021.  The propranolol was very helpful for her anxiety.  It also controlled the palpitations. Try it before naps to see if it prevents NM.  We discussed the short-term risks associated with benzodiazepines including sedation and increased fall risk among others.  Discussed long-term side effect risk including dependence, potential withdrawal symptoms, and the potential eventual dose-related risk of dementia.  But recent studies from 2020 dispute this association between benzodiazepines and dementia  risk. Newer studies in 2020 do not support an association with dementia.  Xanax helped a lot with normal and social anxiety. Continue Xanax and propranolol prn.  Patient needs to sleep in order to control the hypomania.  Limit Xanax DT fatigue. Only taking 0.25 mg daily.  Disc stopping it.  Supportive therapy on managing the recent stressors and disc recent LOA from work for mental health reasons.  Was helpful.  Disc possible return to in office from work at home issues. Disc getting D psych help.  Get more sleep  Auvelity 1 in the AM markedly helped mood  modafinil 400 mg AM  ok reducing lamotrigine to 150 BID except some HA.   FU 4  mos.  Meredith Staggers, MD, DFAPA     Please see After Visit Summary for patient specific instructions.  No future appointments.    No orders of the defined types were placed in this encounter.      -------------------------------

## 2023-12-31 ENCOUNTER — Encounter: Payer: Self-pay | Admitting: Psychiatry

## 2023-12-31 ENCOUNTER — Ambulatory Visit (INDEPENDENT_AMBULATORY_CARE_PROVIDER_SITE_OTHER): Payer: 59 | Admitting: Psychiatry

## 2023-12-31 DIAGNOSIS — F411 Generalized anxiety disorder: Secondary | ICD-10-CM | POA: Diagnosis not present

## 2023-12-31 DIAGNOSIS — F3181 Bipolar II disorder: Secondary | ICD-10-CM | POA: Diagnosis not present

## 2023-12-31 DIAGNOSIS — F4310 Post-traumatic stress disorder, unspecified: Secondary | ICD-10-CM

## 2023-12-31 DIAGNOSIS — F4001 Agoraphobia with panic disorder: Secondary | ICD-10-CM

## 2023-12-31 DIAGNOSIS — R5383 Other fatigue: Secondary | ICD-10-CM

## 2023-12-31 DIAGNOSIS — F5105 Insomnia due to other mental disorder: Secondary | ICD-10-CM

## 2023-12-31 DIAGNOSIS — F902 Attention-deficit hyperactivity disorder, combined type: Secondary | ICD-10-CM

## 2023-12-31 MED ORDER — METHYLPHENIDATE HCL ER (OSM) 54 MG PO TBCR
54.0000 mg | EXTENDED_RELEASE_TABLET | ORAL | 0 refills | Status: DC
Start: 2023-12-31 — End: 2024-02-01

## 2023-12-31 NOTE — Progress Notes (Signed)
 CADANCE RAUS [id number] [birth year] 48 y.o.   Subjective:   Patient ID:  Lindsey [name] is a 48 y.o. (DOB 07/05/1976) female.  Chief Complaint:  Chief Complaint  Patient presents with   Follow-up   Depression   Anxiety    Lindsey [name] presents to today for follow-up of mood and anxiety.  When seen April 27th, 2020.  She was hypomanic at the time and having insomnia.  We increased Xanax  1 mg nightly and she was encouraged to increase oxcarbazepine  to 600 mg nightly to help with the hypomania.  She is fairly sensitive to mood stabilizers and has failed several.  She returned in June reporting hypomania resolved.  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  Finally ended the trauma bond and is better.  Trileptal  and Xanax  at night is better but only taking 1/2 of 300mg  Trileptal  bc sleepiness from 300 mg hs.  She's taking Xanax  0.5 mg AM and HS.  Getting a lot done.  Problematic boss got fired for performance problems.  No meds were changed at the June visit.  seen October 2020 the following was noted:She is now hypomanic with no particular trigger.  This may be associated with a tendency toward spring hypomania and bipolar patients. Chronic compliance problems with addition of meds.  Rec restart Trileptal  150 mg HS. This is off label for bipolar mania [name] hypomania.  She is fairly sensitive to mood stabilizers historically.  She does not want weight gain.   November 17, 2019 appointment, the following is noted: Been working on self growth and taking advantages of resources on internet and self esteem.  Pleased with meds. Not taken Trileptal .   Wonders if she has borderline pd bc splitting and black and white thinking.  Mood swings can be rapid from 30 min to 3 days.  Also triggered by abandonment. Chronic struggles with intrusive mother who she feels triggers her. Panic triggered only with one person.  Social anxiety is less. Working from home helps. Patient reports  stable mood and denies depressed [name] irritable moods.    Patient denies difficulty with sleep initiation [name] maintenance. Denies appetite disturbance.  Patient reports that energy and motivation have been good.  Patient denies any difficulty with concentration.  Patient denies any suicidal ideation. Plan no med changes  03/15/20 appt. With the following noted:   Wrote her out of work for 3 weeks DT multiple stressors with mental health overload including father of child facing jail then mistake at work.  Intense sensitivity to failure.  Couldn't eat and lost 13# in 3 weeks.  Needed break from work to manage stress.  Break was very helpful at restoration. Now doing OK.   Working from home has been good for her.  May ask for excuse to work part of the time from home. Started B12 and D3 which helped energy.  Not overly depressed.  No mood swings.  Incredible fear of abandonment also flared up in rel with current BF. Patient reports stable mood and denies depressed [name] irritable moods.   Patient denies difficulty with sleep initiation [name] maintenance.  NM if naps.  Denies appetite disturbance.  Patient reports that energy and motivation have been good.  Patient denies any difficulty with concentration.  Patient denies any suicidal ideation. Plan: No med changes  11/19/2020 appointment with the following noted: Doing OK.  Meds working fine.  Doing Ok spiritually. Concern is so fatigued.  Not sure the cause.  Wonders  about vitamin deficiency.  Naps when she can.  Not like a depressed fatigue. Sleep is OK.  Average 5 hours nightly during the week.  Partly bc it's quiet. In this pattern for a month [name] so. Big project at work requiring all attention and effort during the day. 20% raise. Drinks caffeine  All day and has for years.  03/27/2021 appointment with the following noted: Doing alright.  Still work stress.  Company bought out.  So expects to get laid off.   Takes Xanax  0.5 mg AM and then 0.25 prn about  2-3 times per month.  Reaches for propranolol  first.  Inderall helped anxiety a whole lot. Stress M recently psychotic and hospitalized and has Poor coping and severe fear of abandoment.. Pt is inconsistent and maybe PMS related and may nap 2-3 hours.  May sleep to shut off the world for a little. Overall is doing OK and at baseline.  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   Still on lamotrigine  150 AM and 300 HS. No SE. Plan: Be careful with caffeine bc anxiety. No med changes indicated  08/15/2021 appointment with the following noted: Situational depression.  Got a raise and not laid off.   Position with corporate team and permanent now.  Will be different after 18 years at [employer].   Still on lamotrigine  150 AM and 300 HS.. Stable depression and working on herself.  Getting better. Want to get stuff done around the house but hard to make herself do it. Sleep pattern terrible with naps and split schedule but getting 8 hours of sleep.  Likes staying up late to have quiet.   D will be 13 in May.  Hates [employer] and declining grades.    Needs transfer.   No concerns about meds. Bad panic in November with trigger of seeing someone with residual sx for a week.  Worst in a long time.  Was trauma trigger. Plan: No med changes  12/18/2021 appointment with the following noted: Doing good.  A lot of stress since here and managed well.  Worked on attachment issues in therapy.   Changed Xnanx to 0.5 mg 1/2 BID and it's helped to split it out.  Helped anxiety to do that. Taking [name]'s father to court for support and custody. [name] a lot of mental health problems including anxiety. Working on losing weight counting calories. Lost 18#. Work from home FT good. Patient reports stable mood and denies depressed [name] irritable moods.   Patient denies difficulty with sleep initiation [name] maintenance. Denies appetite disturbance.  Patient reports that  energy and motivation have been good.  Patient denies any difficulty with concentration.  Patient denies any suicidal ideation.  04/21/22 appt noted Summer stress with anxierty overwhelmed.  Not managed with Xanax  and propranolol .  RX clonidine  off label.  Talked with friend and never tried it bc it got better. Anxity pretty good now.  Was taking propranolol  30 mg daily and now 20 mg daily. Some cycling of fatigue and napping.  A little irritability.  No mania. Tolerating meds. Xanax  0.25 mg AM.  Only. Sleep schedule not always regular. Positives work raise.   Tori into charter school and pleased with change.  08/26/22 appt noted: Something is wrong. Mid Dec had to take 2 days off work bc depressed and burned out.  Cannot seem to pull up out of it.  Not sad but executive function freeze with amotivation.  Trouble  with focus.  Can't pull herself out of it.  Enjoyment 4/10.  Affecting work.  Not much anxiety.  Sleep pattern erratic with average 5 hours and hard to make herself go to bed bc night is her free time.  Normal 7-8 hours.  Some naps. Plan: Wellbutrin  trial  10/28/22 appt noted: Got up to 300 XL wellbutrin  with night sweats that eventually stopped.  Mixed feels about it.  SE Problems getting words out.  Having wt gain.  Didn't improve energy [name] motivation.   Smoking comes and goes but stopped smoking pot with it.  And has a negative feeling on pot now.  None for a month.   Not jittery [name] edgy now and was jaw clenching but resolved. No sig sadness.    Burnt a bridge with someone that was necessary.  Released some anger.   Getting enough sleep. Plan: Yes modafinil  100 to 200 mg AM  01/27/23 appt noted: Increased modafinil  400 mg AM.  No SE.  She thinks it helped to increase it  Continues Wellbutrin  XL 150 AM, lamotrigine  150 AM and 300 PM. Has desire to do things but poor inititative and productivity.  Doing bare minimum.  Not sad.  BR is reflective of mental health but it is a mess.    Not tired. No swings. Plan: Switch Wellbutrin  to Auvelity  1 in the AM for 2 weeks and if no response then increase to twice daily for initiative and motivaltion. modafinil  200 to 400 mg AM  06/02/23 appt noted: Psych med: Auvelity  AM, no Wellbutrin , lamotrigine  150 AM and 300 PM, modafinil  200 mg am and noon, propranolol  10 BID prn anxiety. No SE and within 3-4 days noticed benefit with Auvelity  with mood.   No Xanax  needed lately.  For over a month. Propranolol  prn anticipating meetings.  Still burned out from work.  Falls over in taking care of the house.  But otherwise not sad.  Not as worried.   Satisfied.   Needs to be better caring for he room and home.  Making some progress.   Lost 40# on Zepbound.  Lowest wt as an adult and 100#.    09/01/23 appt noted: Psych med: Auvelity  1 in the AM, lamotrigine  150 AM and 150 PM, modafinil  200 mg am and noon, propranolol  10 BID prn anxiety, added B12 2000 mcg level and helped energy. Reduced lamotrigine  without a problem so far but gets a little HA initially.   Added B12 helped energy noticeably.   Still some lack of motivation but not terrible. Work is going ok. Back MRI today.   Plan no changes  12/31/23 appt noted: Psych med: Auvelity  1 in the AM, lamotrigine  300 AM, modafinil  200 mg am and noon, propranolol  10 BID prn anxiety, added B12 2000 mcg level and helped energy. Promotion at work to [employer] level.  Back with [employer]. Found out in March.  Really likes her boss.   Mood on an up right now.  Went for awhile severely depressed to abnormal degree but maybe environmental.   Wondered about second Auvelity  only about a week,  but kept forgetting it.   Barely managing what needs to be done.  Losing focus at work.  Struggling with motivation. Sleep is ok now.  When dep for 6 weeks was excessive sleep.   Around Sunday mood picked back up.   Bad shopping addiction.  Too much stuff in the house.  For a long time.    Xanax  more calming  than  the others.  Lindsey [name] is 11 yo.  Her father is alcoholic.  Last hypomania was November 2018 and then again April 2018.  Past Psychiatric Medication Trials: Poor response SSRIs,  Effexor, Paxil, sertraline itchy,  Serzone,  Wellbutrin , Strattera nausea,  Vraylar, olanzapine sedation, Latuda fogginess, risperidone, Abilify, Trileptal  300 tired, lamotrigine ,  lithium, propranolol ,    Klonopin, Ativan, Xanax , buspirone nausea NAC,   Review of Systems:  Review of Systems  Constitutional:  Negative for fatigue.  Cardiovascular:  Negative for palpitations.  Neurological:  Negative for tremors.  Psychiatric/Behavioral:  Negative for agitation, behavioral problems, confusion, decreased concentration, dysphoric mood, hallucinations, self-injury, sleep disturbance and suicidal ideas. The patient is not nervous/anxious and is not hyperactive.     Medications: I have reviewed the patient's current medications.  Current Outpatient Medications  Medication Sig Dispense Refill   acetaminophen -codeine (TYLENOL  #3) 300-30 MG tablet Take 1 tablet by mouth every 8 (eight) hours as needed.     ALPRAZolam  (XANAX ) 0.25 MG tablet Take 1 tablet (0.25 mg total) by mouth every 8 (eight) hours as needed. 90 tablet 0   Cholecalciferol (VITAMIN D3) 25 MCG (1000 UT) CAPS Take by mouth.     Cyanocobalamin (VITAMIN B12) 1000 MCG TBCR 1 tablet     Dextromethorphan-buPROPion  ER (AUVELITY ) 45-105 MG TBCR Take 1 tablet by mouth 2 (two) times daily. (Patient taking differently: Take 1 tablet by mouth 2 (two) times daily. Taking 1 daily) 60 tablet 3   lamoTRIgine  (LAMICTAL ) 150 MG tablet Take 1 tablet (150 mg total) by mouth 2 (two) times daily. 180 tablet 1   levonorgestrel  (MIRENA ) 20 MCG/24HR IUD 1 each by Intrauterine route once.     methylphenidate (CONCERTA) 54 MG PO CR tablet Take 1 tablet (54 mg total) by mouth every morning. 30 tablet 0   propranolol  (INDERAL ) 10 MG tablet Take 1 tablet (10 mg total) by mouth  3 (three) times daily as needed. 270 tablet 1   triamcinolone  ointment (KENALOG ) 0.1 % Apply 1 application. topically 2 (two) times daily as needed (Rash). 454 g 11   ZEPBOUND 12.5 MG/0.5ML Pen Inject 12.5 mg into the skin once a week.     No current facility-administered medications for this visit.    Medication Side Effects: None  Allergies:  Allergies  Allergen Reactions   Celexa [Citalopram]     Other reaction(s): tremor    Past Medical History:  Diagnosis Date   Other and unspecified disc disorder of lumbar region    torn lumbar disc?    Family History  Problem Relation Age of Onset   Diabetes Mother    Diabetes Father    Cancer Father        kidney   Cancer Paternal Grandfather        PROSTATE    Social History   Socioeconomic History   Marital status: Single    Spouse name: Not on file   Number of children: Not on file   Years of education: Not on file   Highest education level: Not on file  Occupational History   Not on file  Tobacco Use   Smoking status: Every Day    Current packs/day: 0.75    Types: Cigarettes   Smokeless tobacco: Never  Vaping Use   Vaping status: Never Used  Substance and Sexual Activity   Alcohol use: No   Drug use: No   Sexual activity: Yes    Birth control/protection: I.U.D., Condom    Comment: 1st intercourse- 24, partners-  5, mirena  inserted 04-16-17  Other Topics Concern   Not on file  Social History Narrative   Not on file   Social Drivers of Health   Financial Resource Strain: Not on file  Food Insecurity: Not on file  Transportation Needs: Not on file  Physical Activity: Not on file  Stress: Not on file  Social Connections: Not on file  Intimate Partner Violence: Not on file    Past Medical History, Surgical history, Social history, and Family history were reviewed and updated as appropriate.   Please see review of systems for further details on the patient's review from today.   Objective:   Physical  Exam:  There were no vitals taken for this visit.  Physical Exam Constitutional:      Appearance: She is obese.   Musculoskeletal:        General: No deformity.   Neurological:     Mental Status: She is alert.     Cranial Nerves: No dysarthria.     Motor: No weakness [name] tremor.     Gait: Gait normal.   Psychiatric:        Attention and Perception: Attention and perception normal.        Mood and Affect: Mood is depressed. Mood is not anxious.        Speech: Speech is not rapid and pressured, slurred [name] tangential.        Behavior: Behavior normal. Behavior is cooperative.        Thought Content: Thought content normal. Thought content is not delusional. Thought content does not include suicidal ideation.        Cognition and Memory: Cognition and memory normal. Cognition is not impaired. She does not exhibit impaired recent memory.        Judgment: Judgment normal.     Comments: Mild mood instabilty Better mood and interests with Auvelity , but residual dep sx       Lab Review:     Component Value Date/Time   NA 139 04/10/2021 1324   K 4.0 04/10/2021 1324   CL 106 04/10/2021 1324   CO2 27 04/10/2021 1324   GLUCOSE 88 04/10/2021 1324   BUN 9 04/10/2021 1324   CREATININE 0.70 04/10/2021 1324   CALCIUM 9.1 04/10/2021 1324   PROT 6.4 04/10/2021 1324   ALBUMIN 4.2 08/18/2016 0838   AST 14 04/10/2021 1324   ALT 20 04/10/2021 1324   ALKPHOS 65 08/18/2016 0838   BILITOT 0.4 04/10/2021 1324       Component Value Date/Time   WBC 8.2 04/10/2021 1324   RBC 4.36 04/10/2021 1324   HGB 14.3 04/10/2021 1324   HCT 41.5 04/10/2021 1324   PLT 266 04/10/2021 1324   MCV 95.2 04/10/2021 1324   MCH 32.8 04/10/2021 1324   MCHC 34.5 04/10/2021 1324   RDW 12.0 04/10/2021 1324   LYMPHSABS 2,763 04/10/2021 1324   MONOABS 292 08/18/2016 0838   EOSABS 607 (H) 04/10/2021 1324   BASOSABS 74 04/10/2021 1324    No results found for: POCLITH, LITHIUM   No results found for:  PHENYTOIN, PHENOBARB, VALPROATE, CBMZ   .res Assessment: Plan:    Bipolar II disorder (HCC)- depression - Plan: methylphenidate (CONCERTA) 54 MG PO CR tablet  Generalized anxiety disorder  Insomnia due to mental condition  Panic disorder with agoraphobia  PTSD (post-traumatic stress disorder)  Fatigue, unspecified type  Attention deficit hyperactivity disorder (ADHD), combined type - Plan: methylphenidate (CONCERTA) 54 MG PO CR tablet  Medsensitive.  30 min session   Lindsey [name] has a long history of bipolar  2 disorder.  Usually manic episodes are mild.  Her mother also has bipolar disorder.  She has failed numerous mood stabilizers as noted above.     Prefers sheild-shaped lamotrigine  and oval alprazolam  vs round of each in terms of effectivenss.  Asked her to get the generic manufacturer names for these and we can request them.  Disc differences with generics.  B12 and folate levels normal 03/05/2021.  Likely some memory issues re: ADD underlying bipolar sx.  The propranolol  was very helpful for her anxiety.  It also controlled the palpitations. Try it before naps to see if it prevents NM.  We discussed the short-term risks associated with benzodiazepines including sedation and increased fall risk among others.  Discussed long-term side effect risk including dependence, potential withdrawal symptoms, and the potential eventual dose-related risk of dementia.  But recent studies from 2020 dispute this association between benzodiazepines and dementia risk. Newer studies in 2020 do not support an association with dementia.  Xanax  helped a lot with normal and social anxiety. Continue Xanax  and propranolol  prn.  Patient needs to sleep in order to control the hypomania.  Limit Xanax  DT fatigue. Only taking 0.25 mg daily.  Disc stopping it.  Supportive therapy on managing the recent stressors and disc recent LOA from work for mental health reasons.  Was helpful.  Disc possible  return to in office from work at home issues. Disc getting D psych help.  Get more sleep  Auvelity  1 in the AM partially helped mood  DC modafinil  and switch to Concerta 54 mg .  She likely has ADD plus it could help off label for dep. Discussed potential benefits, risks, and side effects of stimulants with patient to include increased heart rate, hypertension, palpitations, insomnia, increased anxiety, increased irritability, [name] decreased appetite.  Instructed patient to contact office if experiencing any significant tolerability issues. Disc mania risk.  Call if occurs.    Next opiton as alternative pramipexole off label.  lamotrigine  to 150 BID except some HA.   FU 2  mos.  Nori Beat, MD, DFAPA     Please see After Visit Summary for patient specific instructions.  No future appointments.     No orders of the defined types were placed in this encounter.      -------------------------------

## 2024-01-29 ENCOUNTER — Other Ambulatory Visit: Payer: Self-pay

## 2024-01-29 ENCOUNTER — Telehealth: Payer: Self-pay | Admitting: Psychiatry

## 2024-01-29 DIAGNOSIS — F3181 Bipolar II disorder: Secondary | ICD-10-CM

## 2024-01-29 DIAGNOSIS — F902 Attention-deficit hyperactivity disorder, combined type: Secondary | ICD-10-CM

## 2024-01-29 NOTE — Telephone Encounter (Signed)
 RF request sent with message about crashing.

## 2024-01-29 NOTE — Telephone Encounter (Signed)
 Pt called asking for a refill on her concerta  54 mg. Pharmacy is [facility] [address] in [place]. Next appt 03/07/24

## 2024-01-29 NOTE — Telephone Encounter (Signed)
Responded to via MyChart message.

## 2024-02-01 ENCOUNTER — Other Ambulatory Visit: Payer: Self-pay

## 2024-02-01 DIAGNOSIS — F902 Attention-deficit hyperactivity disorder, combined type: Secondary | ICD-10-CM

## 2024-02-01 DIAGNOSIS — F3181 Bipolar II disorder: Secondary | ICD-10-CM

## 2024-02-01 MED ORDER — METHYLPHENIDATE HCL ER (OSM) 54 MG PO TBCR: 54.0000 mg | EXTENDED_RELEASE_TABLET | ORAL | 0 refills | Status: DC

## 2024-02-04 ENCOUNTER — Other Ambulatory Visit: Payer: Self-pay | Admitting: Psychiatry

## 2024-02-04 MED ORDER — METHYLPHENIDATE HCL 10 MG PO TABS: 10.0000 mg | ORAL_TABLET | Freq: Every day | ORAL | 0 refills | Status: DC

## 2024-02-04 NOTE — Telephone Encounter (Signed)
 I sent Ritalin  booster to [facility] [place] where gets concerta 

## 2024-02-04 NOTE — Telephone Encounter (Signed)
 Patient notified

## 2024-02-12 ENCOUNTER — Other Ambulatory Visit: Payer: Self-pay | Admitting: Psychiatry

## 2024-02-12 DIAGNOSIS — F3181 Bipolar II disorder: Secondary | ICD-10-CM

## 2024-02-29 ENCOUNTER — Other Ambulatory Visit: Payer: Self-pay

## 2024-02-29 ENCOUNTER — Telehealth: Payer: Self-pay | Admitting: Psychiatry

## 2024-02-29 DIAGNOSIS — F3181 Bipolar II disorder: Secondary | ICD-10-CM

## 2024-02-29 DIAGNOSIS — F902 Attention-deficit hyperactivity disorder, combined type: Secondary | ICD-10-CM

## 2024-02-29 MED ORDER — METHYLPHENIDATE HCL ER (OSM) 54 MG PO TBCR: 54.0000 mg | EXTENDED_RELEASE_TABLET | ORAL | 0 refills | Status: DC

## 2024-02-29 NOTE — Telephone Encounter (Signed)
 Pended

## 2024-02-29 NOTE — Telephone Encounter (Signed)
 Pt requesting Rx Concerta  54 mg [facility] [address] [place] 8/21

## 2024-03-10 ENCOUNTER — Encounter: Payer: Self-pay | Admitting: Psychiatry

## 2024-03-10 ENCOUNTER — Ambulatory Visit (INDEPENDENT_AMBULATORY_CARE_PROVIDER_SITE_OTHER): Admitting: Psychiatry

## 2024-03-10 DIAGNOSIS — F5105 Insomnia due to other mental disorder: Secondary | ICD-10-CM

## 2024-03-10 DIAGNOSIS — F902 Attention-deficit hyperactivity disorder, combined type: Secondary | ICD-10-CM | POA: Diagnosis not present

## 2024-03-10 DIAGNOSIS — F411 Generalized anxiety disorder: Secondary | ICD-10-CM

## 2024-03-10 DIAGNOSIS — F3181 Bipolar II disorder: Secondary | ICD-10-CM | POA: Diagnosis not present

## 2024-03-10 DIAGNOSIS — F4001 Agoraphobia with panic disorder: Secondary | ICD-10-CM

## 2024-03-10 DIAGNOSIS — F4310 Post-traumatic stress disorder, unspecified: Secondary | ICD-10-CM

## 2024-03-10 DIAGNOSIS — R5383 Other fatigue: Secondary | ICD-10-CM

## 2024-03-10 MED ORDER — LISDEXAMFETAMINE DIMESYLATE 60 MG PO CAPS: 60.0000 mg | ORAL_CAPSULE | Freq: Every day | ORAL | 0 refills | Status: DC

## 2024-03-10 NOTE — Progress Notes (Signed)
 Lindsey [name] [id number] 01/26/1976 48 y.o.   Subjective:   Patient ID:  Lindsey [name] is a 48 y.o. (DOB 09-Mar-1976) female.  Chief Complaint:  Chief Complaint  Patient presents with   Follow-up   Depression   Anxiety   ADD    Lindsey [name] presents to today for follow-up of mood and anxiety.  When seen April 27th, 2020.  She was hypomanic at the time and having insomnia.  We increased Xanax  1 mg nightly and she was encouraged to increase oxcarbazepine  to 600 mg nightly to help with the hypomania.  She is fairly sensitive to mood stabilizers and has failed several.  She returned in June reporting hypomania resolved.  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  Finally ended the trauma bond and is better.  Trileptal  and Xanax  at night is better but only taking 1/2 of 300mg  Trileptal  bc sleepiness from 300 mg hs.  She's taking Xanax  0.5 mg AM and HS.  Getting a lot done.  Problematic boss got fired for performance problems.  No meds were changed at the June visit.  seen October 2020 the following was noted:She is now hypomanic with no particular trigger.  This may be associated with a tendency toward spring hypomania and bipolar patients. Chronic compliance problems with addition of meds.  Rec restart Trileptal  150 mg HS. This is off label for bipolar mania or hypomania.  She is fairly sensitive to mood stabilizers historically.  She does not want weight gain.   November 17, 2019 appointment, the following is noted: Been working on self growth and taking advantages of resources on internet and self esteem.  Pleased with meds. Not taken Trileptal .   Wonders if she has borderline pd bc splitting and black and white thinking.  Mood swings can be rapid from 30 min to 3 days.  Also triggered by abandonment. Chronic struggles with intrusive mother who she feels triggers her. Panic triggered only with one person.  Social anxiety is less. Working from home helps. Patient  reports stable mood and denies depressed or irritable moods.    Patient denies difficulty with sleep initiation or maintenance. Denies appetite disturbance.  Patient reports that energy and motivation have been good.  Patient denies any difficulty with concentration.  Patient denies any suicidal ideation. Plan no med changes  03/15/20 appt. With the following noted:   Wrote her out of work for 3 weeks DT multiple stressors with mental health overload including father of child facing jail then mistake at work.  Intense sensitivity to failure.  Couldn't eat and lost 13# in 3 weeks.  Needed break from work to manage stress.  Break was very helpful at restoration. Now doing OK.   Working from home has been good for her.  May ask for excuse to work part of the time from home. Started B12 and D3 which helped energy.  Not overly depressed.  No mood swings.  Incredible fear of abandonment also flared up in rel with current BF. Patient reports stable mood and denies depressed or irritable moods.   Patient denies difficulty with sleep initiation or maintenance.  NM if naps.  Denies appetite disturbance.  Patient reports that energy and motivation have been good.  Patient denies any difficulty with concentration.  Patient denies any suicidal ideation. Plan: No med changes  11/19/2020 appointment with the following noted: Doing OK.  Meds working fine.  Doing Ok spiritually. Concern is so fatigued.  Not sure the  cause.  Wonders about vitamin deficiency.  Naps when she can.  Not like a depressed fatigue. Sleep is OK.  Average 5 hours nightly during the week.  Partly bc it's quiet. In this pattern for a month or so. Big project at work requiring all attention and effort during the day. 20% raise. Drinks caffeine  All day and has for years.  03/27/2021 appointment with the following noted: Doing alright.  Still work stress.  Company bought out.  So expects to get laid off.   Takes Xanax  0.5 mg AM and then 0.25  prn about 2-3 times per month.  Reaches for propranolol  first.  Inderall helped anxiety a whole lot. Stress M recently psychotic and hospitalized and has Poor coping and severe fear of abandoment.. Pt is inconsistent and maybe PMS related and may nap 2-3 hours.  May sleep to shut off the world for a little. Overall is doing OK and at baseline.  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   Still on lamotrigine  150 AM and 300 HS. No SE. Plan: Be careful with caffeine bc anxiety. No med changes indicated  08/15/2021 appointment with the following noted: Situational depression.  Got a raise and not laid off.   Position with corporate team and permanent now.  Will be different after 18 years at [employer].   Still on lamotrigine  150 AM and 300 HS.. Stable depression and working on herself.  Getting better. Want to get stuff done around the house but hard to make herself do it. Sleep pattern terrible with naps and split schedule but getting 8 hours of sleep.  Likes staying up late to have quiet.   D will be 13 in May.  Hates [employer] and declining grades.    Needs transfer.   No concerns about meds. Bad panic in November with trigger of seeing someone with residual sx for a week.  Worst in a long time.  Was trauma trigger. Plan: No med changes  12/18/2021 appointment with the following noted: Doing good.  A lot of stress since here and managed well.  Worked on attachment issues in therapy.   Changed Xnanx to 0.5 mg 1/2 BID and it's helped to split it out.  Helped anxiety to do that. Taking Lindsey [name]'s father to court for support and custody. Lindsey [name] a lot of mental health problems including anxiety. Working on losing weight counting calories. Lost 18#. Work from home FT good. Patient reports stable mood and denies depressed or irritable moods.   Patient denies difficulty with sleep initiation or maintenance. Denies appetite disturbance.  Patient reports  that energy and motivation have been good.  Patient denies any difficulty with concentration.  Patient denies any suicidal ideation.  04/21/22 appt noted Summer stress with anxierty overwhelmed.  Not managed with Xanax  and propranolol .  RX clonidine  off label.  Talked with friend and never tried it bc it got better. Anxity pretty good now.  Was taking propranolol  30 mg daily and now 20 mg daily. Some cycling of fatigue and napping.  A little irritability.  No mania. Tolerating meds. Xanax  0.25 mg AM.  Only. Sleep schedule not always regular. Positives work raise.   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  08/26/22 appt noted: Something is wrong. Mid Dec had to take 2 days off work bc depressed and burned out.  Cannot seem to pull up out of it.  Not sad but executive function freeze with  amotivation.  Trouble with focus.  Can't pull herself out of it.  Enjoyment 4/10.  Affecting work.  Not much anxiety.  Sleep pattern erratic with average 5 hours and hard to make herself go to bed bc night is her free time.  Normal 7-8 hours.  Some naps. Plan: Wellbutrin  trial  10/28/22 appt noted: Got up to 300 XL wellbutrin  with night sweats that eventually stopped.  Mixed feels about it.  SE Problems getting words out.  Having wt gain.  Didn't improve energy or motivation.   Smoking comes and goes but stopped smoking pot with it.  And has a negative feeling on pot now.  None for a month.   Not jittery or edgy now and was jaw clenching but resolved. No sig sadness.    Burnt a bridge with someone that was necessary.  Released some anger.   Getting enough sleep. Plan: Yes modafinil  100 to 200 mg AM  01/27/23 appt noted: Increased modafinil  400 mg AM.  No SE.  She thinks it helped to increase it  Continues Wellbutrin  XL 150 AM, lamotrigine  150 AM and 300 PM. Has desire to do things but poor inititative and productivity.  Doing bare minimum.  Not sad.  BR is reflective of mental health but it is a mess.    Not tired. No swings. Plan: Switch Wellbutrin  to Auvelity  1 in the AM for 2 weeks and if no response then increase to twice daily for initiative and motivaltion. modafinil  200 to 400 mg AM  06/02/23 appt noted: Psych med: Auvelity  AM, no Wellbutrin , lamotrigine  150 AM and 300 PM, modafinil  200 mg am and noon, propranolol  10 BID prn anxiety. No SE and within 3-4 days noticed benefit with Auvelity  with mood.   No Xanax  needed lately.  For over a month. Propranolol  prn anticipating meetings.  Still burned out from work.  Falls over in taking care of the house.  But otherwise not sad.  Not as worried.   Satisfied.   Needs to be better caring for he room and home.  Making some progress.   Lost 40# on Zepbound.  Lowest wt as an adult and 100#.    09/01/23 appt noted: Psych med: Auvelity  1 in the AM, lamotrigine  150 AM and 150 PM, modafinil  200 mg am and noon, propranolol  10 BID prn anxiety, added B12 2000 mcg level and helped energy. Reduced lamotrigine  without a problem so far but gets a little HA initially.   Added B12 helped energy noticeably.   Still some lack of motivation but not terrible. Work is going ok. Back MRI today.   Plan no changes  12/31/23 appt noted: Psych med: Auvelity  1 in the AM, lamotrigine  300 AM, modafinil  200 mg am and noon, propranolol  10 BID prn anxiety, added B12 2000 mcg level and helped energy. Promotion at work to [employer] level.  Back with [employer]. Found out in March.  Really likes her boss.   Mood on an up right now.  Went for awhile severely depressed to abnormal degree but maybe environmental.   Wondered about second Auvelity  only about a week,  but kept forgetting it.   Barely managing what needs to be done.  Losing focus at work.  Struggling with motivation. Sleep is ok now.  When dep for 6 weeks was excessive sleep.   Around Sunday mood picked back up.   Bad shopping addiction.  Too much stuff in the house.  For a long time.   Plan: DC modafinil   and  switch to Concerta  54 mg .  She likely has ADD plus it could help off label for dep.  03/10/24 appt noted:  Med: Concerta  54 AM, Auvelity  1 in the AM, lamotrigine  300 AM, propranolol  10 BID prn anxiety, added B12 2000 mcg level and helped energy. 5-6 hours of effect from Concerta .  It helps focus.  Works better right before work.  Can hyperfocus with it but not always on right things. Also can still procrastinate.  But is overall more productive. Maybe a little agitation if taken on empty stomach.   Baseline seems flat.  Bored with work. Mild dep.  No mood swings.   No SE  Xanax  more calming than the others.  Metta is 98 yo.  Her father is alcoholic.  Last hypomania was November 2018 and then again April 2018.  Past Psychiatric Medication Trials: Poor response SSRIs,  Effexor, Paxil, sertraline itchy,  Serzone,  Wellbutrin , Vraylar, olanzapine sedation, Latuda fogginess, risperidone, Abilify, Trileptal  300 tired, lamotrigine ,  lithium, propranolol ,    Klonopin, Ativan, Xanax , buspirone nausea NAC, Concerta  54 short effect Modafinil  200  Strattera nausea,   Review of Systems:  Review of Systems  Constitutional:  Negative for fatigue.  Cardiovascular:  Negative for palpitations.  Neurological:  Negative for tremors.  Psychiatric/Behavioral:  Positive for dysphoric mood. Negative for agitation, behavioral problems, confusion, decreased concentration, hallucinations, self-injury, sleep disturbance and suicidal ideas. The patient is not nervous/anxious and is not hyperactive.     Medications: I have reviewed the patient's current medications.  Current Outpatient Medications  Medication Sig Dispense Refill   acetaminophen -codeine (TYLENOL  #3) 300-30 MG tablet Take 1 tablet by mouth every 8 (eight) hours as needed.     ALPRAZolam  (XANAX ) 0.25 MG tablet Take 1 tablet (0.25 mg total) by mouth every 8 (eight) hours as needed. 90 tablet 0   Cholecalciferol (VITAMIN D3) 25 MCG (1000 UT)  CAPS Take by mouth.     Cyanocobalamin (VITAMIN B12) 1000 MCG TBCR 1 tablet     Dextromethorphan-buPROPion  ER (AUVELITY ) 45-105 MG TBCR Take 1 tablet by mouth 2 (two) times daily. (Patient taking differently: Take 1 tablet by mouth 2 (two) times daily. Taking 1 daily) 60 tablet 3   lamoTRIgine  (LAMICTAL ) 150 MG tablet Take 1 tablet by mouth twice daily. 180 tablet 0   levonorgestrel  (MIRENA ) 20 MCG/24HR IUD 1 each by Intrauterine route once.     lisdexamfetamine (VYVANSE ) 60 MG capsule Take 1 capsule (60 mg total) by mouth daily. 30 capsule 0   propranolol  (INDERAL ) 10 MG tablet Take 1 tablet (10 mg total) by mouth 3 (three) times daily as needed. 270 tablet 1   triamcinolone  ointment (KENALOG ) 0.1 % Apply 1 application. topically 2 (two) times daily as needed (Rash). 454 g 11   ZEPBOUND 12.5 MG/0.5ML Pen Inject 12.5 mg into the skin once a week.     No current facility-administered medications for this visit.    Medication Side Effects: None  Allergies:  Allergies  Allergen Reactions   Celexa [Citalopram]     Other reaction(s): tremor    Past Medical History:  Diagnosis Date   Other and unspecified disc disorder of lumbar region    torn lumbar disc?    Family History  Problem Relation Age of Onset   Diabetes Mother    Diabetes Father    Cancer Father        kidney   Cancer Paternal Grandfather        PROSTATE  Social History   Socioeconomic History   Marital status: Single    Spouse name: Not on file   Number of children: Not on file   Years of education: Not on file   Highest education level: Not on file  Occupational History   Not on file  Tobacco Use   Smoking status: Every Day    Current packs/day: 0.75    Types: Cigarettes   Smokeless tobacco: Never  Vaping Use   Vaping status: Never Used  Substance and Sexual Activity   Alcohol use: No   Drug use: No   Sexual activity: Yes    Birth control/protection: I.U.D., Condom    Comment: 1st intercourse- 24,  partners- 7, mirena  inserted 04-16-17  Other Topics Concern   Not on file  Social History Narrative   Not on file   Social Drivers of Health   Financial Resource Strain: Not on file  Food Insecurity: Not on file  Transportation Needs: Not on file  Physical Activity: Not on file  Stress: Not on file  Social Connections: Not on file  Intimate Partner Violence: Not on file    Past Medical History, Surgical history, Social history, and Family history were reviewed and updated as appropriate.   Please see review of systems for further details on the patient's review from today.   Objective:   Physical Exam:  There were no vitals taken for this visit.  Physical Exam Constitutional:      Appearance: She is obese.  Musculoskeletal:        General: No deformity.  Neurological:     Mental Status: She is alert.     Cranial Nerves: No dysarthria.     Motor: No weakness or tremor.     Gait: Gait normal.  Psychiatric:        Attention and Perception: Attention and perception normal.        Mood and Affect: Mood is depressed. Mood is not anxious.        Speech: Speech is not rapid and pressured, slurred or tangential.        Behavior: Behavior normal. Behavior is cooperative.        Thought Content: Thought content normal. Thought content is not delusional. Thought content does not include suicidal ideation.        Cognition and Memory: Cognition and memory normal. Cognition is not impaired. She does not exhibit impaired recent memory.        Judgment: Judgment normal.     Comments: Mild mood instabilty Better mood and interests with Auvelity , but residual dep sx       Lab Review:     Component Value Date/Time   NA 139 04/10/2021 1324   K 4.0 04/10/2021 1324   CL 106 04/10/2021 1324   CO2 27 04/10/2021 1324   GLUCOSE 88 04/10/2021 1324   BUN 9 04/10/2021 1324   CREATININE 0.70 04/10/2021 1324   CALCIUM 9.1 04/10/2021 1324   PROT 6.4 04/10/2021 1324   ALBUMIN 4.2  08/18/2016 0838   AST 14 04/10/2021 1324   ALT 20 04/10/2021 1324   ALKPHOS 65 08/18/2016 0838   BILITOT 0.4 04/10/2021 1324       Component Value Date/Time   WBC 8.2 04/10/2021 1324   RBC 4.36 04/10/2021 1324   HGB 14.3 04/10/2021 1324   HCT 41.5 04/10/2021 1324   PLT 266 04/10/2021 1324   MCV 95.2 04/10/2021 1324   MCH 32.8 04/10/2021 1324   MCHC 34.5 04/10/2021  1324   RDW 12.0 04/10/2021 1324   LYMPHSABS 2,763 04/10/2021 1324   MONOABS 292 08/18/2016 0838   EOSABS 607 (H) 04/10/2021 1324   BASOSABS 74 04/10/2021 1324    No results found for: POCLITH, LITHIUM   No results found for: PHENYTOIN, PHENOBARB, VALPROATE, CBMZ   .res Assessment: Plan:    Bipolar II disorder (HCC)- depression  Attention deficit hyperactivity disorder (ADHD), combined type - Plan: lisdexamfetamine (VYVANSE ) 60 MG capsule  Generalized anxiety disorder  Insomnia due to mental condition  Panic disorder with agoraphobia  PTSD (post-traumatic stress disorder)  Fatigue, unspecified type  Medsensitive.  30 min session   Duyen has a long history of bipolar  2 disorder.  Usually manic episodes are mild.  Her mother also has bipolar disorder.  She has failed numerous mood stabilizers as noted above.     Prefers sheild-shaped lamotrigine  and oval alprazolam  vs round of each in terms of effectivenss.  Asked her to get the generic manufacturer names for these and we can request them.  Disc differences with generics.  B12 and folate levels normal 03/05/2021.  Likely some memory issues re: ADD underlying bipolar sx.  The propranolol  was very helpful for her anxiety.  It also controlled the palpitations. Try it before naps to see if it prevents NM.  We discussed the short-term risks associated with benzodiazepines including sedation and increased fall risk among others.  Discussed long-term side effect risk including dependence, potential withdrawal symptoms, and the potential eventual  dose-related risk of dementia.  But recent studies from 2020 dispute this association between benzodiazepines and dementia risk. Newer studies in 2020 do not support an association with dementia.  Xanax  helped a lot with normal and social anxiety. Continue Xanax  and propranolol  prn.  Patient needs to sleep in order to control the hypomania.  Limit Xanax  DT fatigue. Only taking 0.25 mg daily.  Disc stopping it.  Supportive therapy on managing the recent stressors and disc recent LOA from work for mental health reasons.  Was helpful.  Disc possible return to in office from work at home issues. Disc getting D psych help.  Get more sleep  Auvelity  1 in the AM partially helped mood  DC Concerta  and switch to Vyvanse  for longer duration. Discussed potential benefits, risks, and side effects of stimulants with patient to include increased heart rate, hypertension, palpitations, insomnia, increased anxiety, increased irritability, or decreased appetite.  Instructed patient to contact office if experiencing any significant tolerability issues. Disc mania risk.  Call if occurs.    Next opiton as alternative pramipexole off label.  lamotrigine  to 150 BID except some HA.   FU 2  mos.  Lorene Macintosh, MD, DFAPA     Please see After Visit Summary for patient specific instructions.  No future appointments.     No orders of the defined types were placed in this encounter.      -------------------------------

## 2024-03-27 ENCOUNTER — Other Ambulatory Visit: Payer: Self-pay

## 2024-03-27 ENCOUNTER — Emergency Department (HOSPITAL_COMMUNITY)

## 2024-03-27 ENCOUNTER — Observation Stay (HOSPITAL_COMMUNITY)
Admission: EM | Admit: 2024-03-27 | Discharge: 2024-03-29 | Disposition: A | Discharge status: Home / Self Care | Source: Ambulatory Visit | Attending: General Surgery | Admitting: General Surgery

## 2024-03-27 ENCOUNTER — Encounter (HOSPITAL_COMMUNITY): Payer: Self-pay

## 2024-03-27 ENCOUNTER — Inpatient Hospital Stay
Admission: RE | Admit: 2024-03-27 | Discharge: 2024-03-27 | Discharge status: Home / Self Care | Payer: Self-pay | Attending: Physician Assistant

## 2024-03-27 VITALS — BP 126/85 | HR 97 | Temp 98.2°F | Resp 18 | Ht 65.0 in | Wt 175.0 lb

## 2024-03-27 DIAGNOSIS — R1011 Right upper quadrant pain: Secondary | ICD-10-CM

## 2024-03-27 DIAGNOSIS — K8012 Calculus of gallbladder with acute and chronic cholecystitis without obstruction: Principal | ICD-10-CM | POA: Insufficient documentation

## 2024-03-27 DIAGNOSIS — R110 Nausea: Secondary | ICD-10-CM | POA: Diagnosis not present

## 2024-03-27 DIAGNOSIS — F17210 Nicotine dependence, cigarettes, uncomplicated: Secondary | ICD-10-CM | POA: Diagnosis not present

## 2024-03-27 DIAGNOSIS — K8010 Calculus of gallbladder with chronic cholecystitis without obstruction: Secondary | ICD-10-CM | POA: Diagnosis present

## 2024-03-27 DIAGNOSIS — K819 Cholecystitis, unspecified: Secondary | ICD-10-CM

## 2024-03-27 LAB — COMPREHENSIVE METABOLIC PANEL WITH GFR
ALT: 18 U/L (ref 0–44)
AST: 16 U/L (ref 15–41)
Albumin: 4.2 g/dL (ref 3.5–5.0)
Alkaline Phosphatase: 71 U/L (ref 38–126)
Anion gap: 11 (ref 5–15)
BUN: 10 mg/dL (ref 6–20)
CO2: 23 mmol/L (ref 22–32)
Calcium: 9.3 mg/dL (ref 8.9–10.3)
Chloride: 105 mmol/L (ref 98–111)
Creatinine, Ser: 0.62 mg/dL (ref 0.44–1.00)
GFR, Estimated: >60 mL/min (ref >60)
Glucose, Bld: 82 mg/dL (ref 70–99)
Potassium: 3.9 mmol/L (ref 3.5–5.1)
Sodium: 139 mmol/L (ref 135–145)
Total Bilirubin: 0.3 mg/dL (ref 0.0–1.2)
Total Protein: 6.4 g/dL — ABNORMAL LOW (ref 6.5–8.1)

## 2024-03-27 LAB — CBC
HCT: 41.7 % (ref 36.0–46.0)
Hemoglobin: 14.1 g/dL (ref 12.0–15.0)
MCH: 31.9 pg (ref 26.0–34.0)
MCHC: 33.8 g/dL (ref 30.0–36.0)
MCV: 94.3 fL (ref 80.0–100.0)
Platelets: 241 K/uL (ref 150–400)
RBC: 4.42 MIL/uL (ref 3.87–5.11)
RDW: 12 % (ref 11.5–15.5)
WBC: 8.6 K/uL (ref 4.0–10.5)
nRBC: 0 % (ref 0.0–0.2)

## 2024-03-27 LAB — URINALYSIS, ROUTINE W REFLEX MICROSCOPIC
Bilirubin Urine: NEGATIVE
Glucose, UA: NEGATIVE mg/dL
Ketones, ur: NEGATIVE mg/dL
Leukocytes,Ua: NEGATIVE
Nitrite: NEGATIVE
Protein, ur: NEGATIVE mg/dL
Specific Gravity, Urine: 1.013 (ref 1.005–1.030)
pH: 6 (ref 5.0–8.0)

## 2024-03-27 LAB — LIPASE, BLOOD: Lipase: 38 U/L (ref 11–51)

## 2024-03-27 LAB — HCG, SERUM, QUALITATIVE: Preg, Serum: NEGATIVE

## 2024-03-27 MED ORDER — PANTOPRAZOLE SODIUM 40 MG IV SOLR
40.0000 mg | Freq: Every day | INTRAVENOUS | Status: DC
  Administered 2024-03-27 – 2024-03-28 (×2): 40 mg via INTRAVENOUS
  Filled 2024-03-27 (×2): qty 10

## 2024-03-27 MED ORDER — ONDANSETRON HCL 4 MG/2ML IJ SOLN
4.0000 mg | Freq: Once | INTRAMUSCULAR | Status: AC
  Administered 2024-03-27: 4 mg via INTRAVENOUS
  Filled 2024-03-27: qty 2

## 2024-03-27 MED ORDER — SODIUM CHLORIDE 0.9 % IV SOLN
2.0000 g | Freq: Once | INTRAVENOUS | Status: AC
  Administered 2024-03-27: 2 g via INTRAVENOUS
  Filled 2024-03-27: qty 20

## 2024-03-27 MED ORDER — ONDANSETRON 4 MG PO TBDP
4.0000 mg | ORAL_TABLET | Freq: Four times a day (QID) | ORAL | Status: DC | PRN
Start: 2024-03-27 — End: 2024-03-29

## 2024-03-27 MED ORDER — MORPHINE SULFATE (PF) 2 MG/ML IV SOLN
1.0000 mg | INTRAVENOUS | Status: DC | PRN
  Administered 2024-03-27 – 2024-03-28 (×3): 2 mg via INTRAVENOUS
  Filled 2024-03-27 (×3): qty 1

## 2024-03-27 MED ORDER — MORPHINE SULFATE (PF) 4 MG/ML IV SOLN
4.0000 mg | Freq: Once | INTRAVENOUS | Status: AC
  Administered 2024-03-27: 4 mg via INTRAVENOUS
  Filled 2024-03-27: qty 1

## 2024-03-27 MED ORDER — SODIUM CHLORIDE 0.9 % IV BOLUS
1000.0000 mL | Freq: Once | INTRAVENOUS | Status: AC
  Administered 2024-03-27: 1000 mL via INTRAVENOUS

## 2024-03-27 MED ORDER — SODIUM CHLORIDE 0.9 % IV SOLN: INTRAVENOUS | Status: DC

## 2024-03-27 MED ORDER — ONDANSETRON HCL 4 MG/2ML IJ SOLN
4.0000 mg | Freq: Four times a day (QID) | INTRAMUSCULAR | Status: DC | PRN
  Administered 2024-03-28: 4 mg via INTRAVENOUS
  Filled 2024-03-27: qty 2

## 2024-03-27 MED ORDER — PIPERACILLIN-TAZOBACTAM 3.375 G IVPB
3.3750 g | Freq: Three times a day (TID) | INTRAVENOUS | Status: DC
  Administered 2024-03-27 – 2024-03-28 (×2): 3.375 g via INTRAVENOUS
  Filled 2024-03-27 (×2): qty 50

## 2024-03-27 MED ORDER — ONDANSETRON 4 MG PO TBDP
4.0000 mg | ORAL_TABLET | Freq: Once | ORAL | Status: AC
  Administered 2024-03-27: 4 mg via ORAL

## 2024-03-27 MED ORDER — FENTANYL CITRATE PF 50 MCG/ML IJ SOSY
50.0000 ug | PREFILLED_SYRINGE | Freq: Once | INTRAMUSCULAR | Status: AC
  Administered 2024-03-27: 50 ug via INTRAVENOUS
  Filled 2024-03-27: qty 1

## 2024-03-27 NOTE — ED Provider Notes (Cosign Needed Addendum)
 [place] EMERGENCY DEPARTMENT AT [facility] Provider Note   CSN: [id number] Arrival date & time: 03/27/24  1216     Patient presents with: Abdominal Pain   Lindsey [name] is a 48 y.o. female.   48 year old female presents today for concern of right upper quadrant ultrasound.  She states in February of this year she had an MRI done for her spine and it discovered she had some gallstones.  She states until now it has not caused any issues but right now she is having constant pain that occasionally also becomes real severe.  Last night she states it was a 11/10 and she contemplated coming into the emergency department but ultimately waited until this morning and went to urgent care.  She states they were concerned about biliary colic and referred her to the emergency department.  Endorses nausea.  Denies any fever, hematemesis.  The history is provided by the patient. No language interpreter was used.       Prior to Admission medications   Medication Sig Start Date End Date Taking? Authorizing Provider  acetaminophen -codeine (TYLENOL  #3) 300-30 MG tablet Take 1 tablet by mouth every 8 (eight) hours as needed. 01/23/21   [provider]  ALPRAZolam  (XANAX ) 0.25 MG tablet Take 1 tablet (0.25 mg total) by mouth every 8 (eight) hours as needed. 09/01/23   Cottle, Lorene KANDICE Raddle., MD  Cholecalciferol (VITAMIN D3) 25 MCG (1000 UT) CAPS Take by mouth.    [provider]  Cyanocobalamin (VITAMIN B12) 1000 MCG TBCR 1 tablet    [provider]  Dextromethorphan -buPROPion  ER (AUVELITY ) 45-105 MG TBCR Take 1 tablet by mouth 2 (two) times daily. Patient taking differently: Take 1 tablet by mouth 2 (two) times daily. Taking 1 daily 09/01/23   Cottle, Lorene KANDICE Raddle., MD  lamoTRIgine  (LAMICTAL ) 150 MG tablet Take 1 tablet by mouth twice daily. 02/12/24   Cottle, Lorene KANDICE Raddle., MD  levonorgestrel  (MIRENA ) 20 MCG/24HR IUD 1 each by Intrauterine route once.    [provider]  lisdexamfetamine (VYVANSE ) 60 MG capsule Take 1 capsule (60 mg total) by mouth daily. 03/10/24   Cottle, Lorene KANDICE Raddle., MD  propranolol  (INDERAL ) 10 MG tablet Take 1 tablet (10 mg total) by mouth 3 (three) times daily as needed. 09/01/23   Cottle, Lorene KANDICE Raddle., MD  triamcinolone  ointment (KENALOG ) 0.1 % Apply 1 application. topically 2 (two) times daily as needed (Rash). 09/26/21   Sheffield, Kelli R, PA-C  ZEPBOUND 12.5 MG/0.5ML Pen Inject 12.5 mg into the skin once a week.    [provider]    Allergies: Celexa [citalopram]    Review of Systems  Constitutional:  Negative for fever.  Respiratory:  Negative for shortness of breath.   Cardiovascular:  Negative for chest pain.  Gastrointestinal:  Positive for abdominal pain. Negative for nausea and vomiting.  Genitourinary:  Negative for dysuria.  Neurological:  Negative for light-headedness.  All other systems reviewed and are negative.   Updated Vital Signs BP (!) 131/90 (BP Location: Right Arm)   Pulse 80   Temp 98.7 F (37.1 C) (Oral)   Resp 18   LMP 03/23/2024 (Within Days)   SpO2 100%   Physical Exam Vitals and nursing note reviewed.  Constitutional:      General: She is not in acute distress.    Appearance: Normal appearance. She is not ill-appearing.  HENT:     Head: Normocephalic and atraumatic.     Nose: Nose normal.  Eyes:     Conjunctiva/sclera: Conjunctivae normal.  Cardiovascular:     Rate and Rhythm: Normal rate and regular rhythm.  Pulmonary:     Effort: Pulmonary effort is normal. No respiratory distress.  Abdominal:     General: There is no distension.     Tenderness: There is abdominal tenderness (Right upper quadrant tenderness). There is no right CVA tenderness, left CVA tenderness, guarding or rebound.  Musculoskeletal:        General: No deformity. Normal range of motion.     Cervical back: Normal range of motion.  Skin:    Findings: No rash.  Neurological:     Mental Status: She is  alert.     (all labs ordered are listed, but only abnormal results are displayed) Labs Reviewed  COMPREHENSIVE METABOLIC PANEL WITH GFR - Abnormal; Notable for the following components:      Result Value   Total Protein 6.4 (*)    All other components within normal limits  URINALYSIS, ROUTINE W REFLEX MICROSCOPIC - Abnormal; Notable for the following components:   Hgb urine dipstick SMALL (*)    Bacteria, UA RARE (*)    All other components within normal limits  LIPASE, BLOOD  CBC  HCG, SERUM, QUALITATIVE    EKG: None  Radiology: No results found.   .Critical Care  Performed by: Hildegard Loge, PA-C Authorized by: Hildegard Loge, PA-C   Critical care provider statement:    Critical care time (minutes):  30   Critical care was necessary to treat or prevent imminent or life-threatening deterioration of the following conditions: acute cholecystitis.   Critical care was time spent personally by me on the following activities:  Development of treatment plan with patient or surrogate, discussions with consultants, evaluation of patient's response to treatment, examination of patient, ordering and review of laboratory studies, ordering and review of radiographic studies, ordering and performing treatments and interventions, pulse oximetry, re-evaluation of patient's condition and review of old charts   Care discussed with: admitting provider      Medications Ordered in the ED  ondansetron  (ZOFRAN ) injection 4 mg (4 mg Intravenous Given 03/27/24 1349)  morphine  (PF) 4 MG/ML injection 4 mg (4 mg Intravenous Given 03/27/24 1349)  sodium chloride  0.9 % bolus 1,000 mL (1,000 mLs Intravenous New Bag/Given 03/27/24 1350)    Clinical Course as of 03/27/24 1738  Sun Mar 27, 2024  1644 Spoke with Dr. Curvin.  He will come evaluate patient. [AA]    Clinical Course User Index [AA] Hildegard Loge, PA-C                                 Medical Decision Making Amount and/or Complexity of Data  Reviewed Labs: ordered. Radiology: ordered.  Risk Prescription drug management. Decision regarding hospitalization.   Medical Decision Making / ED Course   This patient presents to the ED for concern of right upper quadrant abdominal pain, this involves an extensive number of treatment options, and is a complaint that carries with it a high risk of complications and morbidity.  The differential diagnosis includes cholecystitis, gastritis, appendicitis, pyelonephritis, UTI, nephrolithiasis  MDM: 48 year old female presents with right upper quadrant pain.  Started last night.  Went to urgent care today and referred here for biliary colic.  Has known gallstones.  No prior flareup. Currently appears comfortable on exam. Will obtain labs, provide pain control, fluids, obtain right upper quadrant ultrasound.  CBC  unremarkable, CMP without acute concern.  Pregnancy test negative.  UA without evidence of UTI.  Right upper quadrant ultrasound shows concern for acute cholecystitis.  Discussed with general surgeon and they will come evaluate patient.  Rocephin  ordered. Last oral intake around 9:30 AM which was a protein shake.  History of C-section otherwise no abdominal surgical history.   Additional history obtained: -Additional history obtained from urgent care visit -External records from outside source obtained and reviewed including: Chart review including previous notes, labs, imaging, consultation notes   Lab Tests: -I ordered, reviewed, and interpreted labs.   The pertinent results include:   Labs Reviewed  COMPREHENSIVE METABOLIC PANEL WITH GFR - Abnormal; Notable for the following components:      Result Value   Total Protein 6.4 (*)    All other components within normal limits  URINALYSIS, ROUTINE W REFLEX MICROSCOPIC - Abnormal; Notable for the following components:   Hgb urine dipstick SMALL (*)    Bacteria, UA RARE (*)    All other components within normal limits  LIPASE,  BLOOD  CBC  HCG, SERUM, QUALITATIVE      EKG  EKG Interpretation Date/Time:    Ventricular Rate:    PR Interval:    QRS Duration:    QT Interval:    QTC Calculation:   R Axis:      Text Interpretation:           Imaging Studies ordered: I ordered imaging studies including right upper quadrant ultrasound I independently visualized and interpreted imaging. I agree with the radiologist interpretation   Medicines ordered and prescription drug management: Meds ordered this encounter  Medications   ondansetron  (ZOFRAN ) injection 4 mg   morphine  (PF) 4 MG/ML injection 4 mg   sodium chloride  0.9 % bolus 1,000 mL   fentaNYL  (SUBLIMAZE ) injection 50 mcg   cefTRIAXone  (ROCEPHIN ) 2 g in sodium chloride  0.9 % 100 mL IVPB    Antibiotic Indication::   Intra-abdominal    -I have reviewed the patients home medicines and have made adjustments as needed  Critical interventions Antibiotics, surgery consult  Consultations Obtained: I requested consultation with the general surgery,  and discussed lab and imaging findings as well as pertinent plan - they recommend: As above  Reevaluation: After the interventions noted above, I reevaluated the patient and found that they have :improved pain improved  Co morbidities that complicate the patient evaluation  Past Medical History:  Diagnosis Date   Other and unspecified disc disorder of lumbar region    torn lumbar disc?      Dispostion: General surgeon to come evaluate patient.  @PCDICTATION @   48 year old female presents with right upper quadrant pain.  Started last night.  Went to urgent care today and referred here for biliary colic.  Has known gallstones.  No prior flareup. Currently appears comfortable on exam. Will obtain labs, provide pain control, fluids, obtain right upper quadrant ultrasound.  CBC unremarkable, CMP without acute concern.  Pregnancy test negative.  UA without evidence of UTI.  Right upper quadrant  ultrasound shows concern for acute cholecystitis.  Discussed with general surgeon and they will come evaluate patient.  Rocephin  ordered. Last oral intake around 9:30 AM which was a protein shake.  History of C-section otherwise no abdominal surgical history.  Final diagnoses:  RUQ abdominal pain  Cholecystitis    ED Discharge Orders     None          Hildegard Loge, [place] 03/27/24 1827  Hildegard Loge, PA-C 03/27/24 [place]    Patsey Lot, MD 03/30/24 [phone]

## 2024-03-27 NOTE — H&P (Signed)
 Lindsey [name] is an 48 y.o. female.   Chief Complaint: abd pain HPI: The patient is a 48 year old white female who presents with abdominal pain that started on Friday.  The pain is located in her right upper quadrant.  The pain has been associated with some nausea and vomiting.  She came to the emergency department where an ultrasound showed that she does have stones in her gallbladder and some mild gallbladder wall thickening.  Her liver functions were normal.  She reports that she is known about her gallstones for quite some time.  She does smoke about a pack of cigarettes a day.  Past Medical History:  Diagnosis Date   Other and unspecified disc disorder of lumbar region    torn lumbar disc?    Past Surgical History:  Procedure Laterality Date   CESAREAN SECTION     INTRAUTERINE DEVICE (IUD) INSERTION     mirena  removed & re-inserted 04-16-17   WISDOM TOOTH EXTRACTION      Family History  Problem Relation Age of Onset   Diabetes Mother    Diabetes Father    Cancer Father        kidney   Cancer Paternal Grandfather        PROSTATE   Social History:  reports that she has been smoking cigarettes. She has never used smokeless tobacco. She reports that she does not drink alcohol and does not use drugs.  Allergies:  Allergies  Allergen Reactions   Citalopram Other (See Comments)    Tremors    (Not in a hospital admission)   Results for orders placed or performed during the hospital encounter of 03/27/24 (from the past 48 hours)  Lipase, blood     Status: None   Collection Time: 03/27/24 12:45 PM  Result Value Ref Range   Lipase 38 11 - 51 U/L    Comment: Performed at [facility], [address] [place], [place] [address]  Comprehensive metabolic panel     Status: Abnormal   Collection Time: 03/27/24 12:45 PM  Result Value Ref Range   Sodium 139 135 - 145 mmol/L   Potassium 3.9 3.5 - 5.1 mmol/L   Chloride 105 98 - 111 mmol/L   CO2 23 22 - 32  mmol/L   Glucose, Bld 82 70 - 99 mg/dL    Comment: Glucose reference range applies only to samples taken after fasting for at least 8 hours.   BUN 10 6 - 20 mg/dL   Creatinine, Ser 9.37 0.44 - 1.00 mg/dL   Calcium 9.3 8.9 - 89.6 mg/dL   Total Protein 6.4 (L) 6.5 - 8.1 g/dL   Albumin 4.2 3.5 - 5.0 g/dL   AST 16 15 - 41 U/L   ALT 18 0 - 44 U/L   Alkaline Phosphatase 71 38 - 126 U/L   Total Bilirubin 0.3 0.0 - 1.2 mg/dL   GFR, Estimated >39 >39 mL/min    Comment: (NOTE) Calculated using the CKD-EPI Creatinine Equation (2021)    Anion gap 11 5 - 15    Comment: Performed at [facility], [address], [place], [place] [address]  CBC     Status: None   Collection Time: 03/27/24 12:45 PM  Result Value Ref Range   WBC 8.6 4.0 - 10.5 K/uL   RBC 4.42 3.87 - 5.11 MIL/uL   Hemoglobin 14.1 12.0 - 15.0 g/dL   HCT 58.2 63.9 - 53.9 %   MCV 94.3 80.0 - 100.0  fL   MCH 31.9 26.0 - 34.0 pg   MCHC 33.8 30.0 - 36.0 g/dL   RDW 87.9 88.4 - 84.4 %   Platelets 241 150 - 400 K/uL   nRBC 0.0 0.0 - 0.2 %    Comment: Performed at [facility], [address], [place], [place] [address]  hCG, serum, qualitative     Status: None   Collection Time: 03/27/24 12:45 PM  Result Value Ref Range   Preg, Serum NEGATIVE NEGATIVE    Comment:        THE SENSITIVITY OF THIS METHODOLOGY IS >10 mIU/mL. Performed at [facility], [address] [place], [place] [address]   Urinalysis, Routine w reflex microscopic -Urine, Clean Catch     Status: Abnormal   Collection Time: 03/27/24 12:52 PM  Result Value Ref Range   Color, Urine YELLOW YELLOW   APPearance CLEAR CLEAR   Specific Gravity, Urine 1.013 1.005 - 1.030   pH 6.0 5.0 - 8.0   Glucose, UA NEGATIVE NEGATIVE mg/dL   Hgb urine dipstick SMALL (A) NEGATIVE   Bilirubin Urine NEGATIVE NEGATIVE   Ketones, ur NEGATIVE NEGATIVE mg/dL   Protein, ur NEGATIVE NEGATIVE mg/dL   Nitrite NEGATIVE NEGATIVE    Leukocytes,Ua NEGATIVE NEGATIVE   RBC / HPF 0-5 0 - 5 RBC/hpf   WBC, UA 0-5 0 - 5 WBC/hpf   Bacteria, UA RARE (A) NONE SEEN   Squamous Epithelial / HPF 0-5 0 - 5 /HPF   Mucus PRESENT     Comment: Performed at [facility], [address], [place], [place] [address]   US  Abdomen Limited RUQ (LIVER/GB) Result Date: 03/27/2024 CLINICAL DATA:  Right upper quadrant abdominal pain. EXAM: ULTRASOUND ABDOMEN LIMITED RIGHT UPPER QUADRANT COMPARISON:  None Available. FINDINGS: Gallbladder: Cholelithiasis and sludge is noted within distended gallbladder. Positive sonographic Murphy's sign is noted. Mild wall thickening is noted at 3 mm. Common bile duct: Diameter: 7 mm which is mildly dilated. Correlation with liver function tests is recommended to rule out obstruction. Liver: No focal lesion identified. Within normal limits in parenchymal echogenicity. Portal vein is patent on color Doppler imaging with normal direction of blood flow towards the liver. Other: None. IMPRESSION: 1. Cholelithiasis and sludge is noted within distended gallbladder. Mild gallbladder wall thickening is noted as well as positive sonographic Murphy's sign. Findings are concerning for acute cholecystitis. 2. Mildly dilated common bile duct is noted. Correlation with liver function tests is recommended to rule out obstruction. Electronically Signed   By: Lynwood Landy Raddle M.D.   On: 03/27/2024 15:57    Review of Systems  Constitutional: Negative.   HENT: Negative.    Eyes: Negative.   Respiratory: Negative.    Cardiovascular: Negative.   Gastrointestinal:  Positive for abdominal pain, nausea and vomiting.  Endocrine: Negative.   Genitourinary: Negative.   Musculoskeletal: Negative.   Skin: Negative.   Allergic/Immunologic: Negative.   Neurological: Negative.   Hematological: Negative.   Psychiatric/Behavioral: Negative.      Blood pressure 123/80, pulse 77, temperature 98.6 F (37 C), temperature source  Oral, resp. rate 18, last menstrual period 03/23/2024, SpO2 99%. Physical Exam Constitutional:      General: She is not in acute distress.    Appearance: Normal appearance. She is well-developed.  HENT:     Head: Normocephalic and atraumatic.     Right Ear: External ear normal.     Left Ear: External ear normal.     Nose:  Nose normal.     Mouth/Throat:     Mouth: Mucous membranes are moist.     Pharynx: Oropharynx is clear.  Eyes:     General: No scleral icterus.    Extraocular Movements: Extraocular movements intact.     Pupils: Pupils are equal, round, and reactive to light.  Cardiovascular:     Rate and Rhythm: Normal rate and regular rhythm.  Pulmonary:     Effort: Pulmonary effort is normal. No respiratory distress.     Breath sounds: Normal breath sounds.  Abdominal:     General: Abdomen is flat. Bowel sounds are normal. There is no distension.     Comments: There is moderate focal tenderness in the right upper quadrant.  There is no palpable mass  Musculoskeletal:        General: No swelling or deformity. Normal range of motion.     Cervical back: Normal range of motion and neck supple.  Skin:    General: Skin is warm and dry.     Coloration: Skin is not jaundiced.  Neurological:     General: No focal deficit present.     Mental Status: She is alert and oriented to person, place, and time.  Psychiatric:        Mood and Affect: Mood normal.        Behavior: Behavior normal.      Assessment/Plan The patient appears to have cholelithiasis with cholecystitis.  Because of the risk of further painful episodes and possible pancreatitis she would likely benefit from having her gallbladder removed during this hospitalization.  We will plan to admit her for broad-spectrum antibiotic therapy and IV hydration and pain management.  We will talk to the primary team in the morning about timing of surgery.  Deward Null III, MD 03/27/2024, 6:24 PM

## 2024-03-27 NOTE — Discharge Instructions (Addendum)
 VISIT SUMMARY:  You came in today due to severe pain under your right rib that radiates to your back and shoulder, which started on Friday night. You also reported nausea, chills, and right-sided chest pain. You have a history of gallstones, and we suspect that you might be experiencing a gallbladder attack due to a possible gallstone obstruction.  YOUR PLAN:  -RIGHT UPPER QUADRANT ABDOMINAL PAIN, LIKELY BILIARY COLIC: Biliary colic is severe pain caused by a gallstone temporarily blocking the bile duct. I recommend that you visit the emergency room for an ultrasound or CT scan to see if there is a gallstone obstruction. You should arrange transportation to the emergency room, and if your symptoms worsen please call 911 for assistance.   -NAUSEA AND CHILLS ASSOCIATED WITH BILIARY COLIC: Your nausea and chills are likely related to the gallbladder issue. These symptoms suggest your body is reacting to the possible gallstone obstruction.    INSTRUCTIONS:  Please go to the emergency room as soon as possible for further imaging and assessment. If your symptoms worsen, contact emergency medical services immediately.

## 2024-03-27 NOTE — ED Triage Notes (Addendum)
 Pt presents with a chief complaint of right upper abdominal pain x 2 days. States the pain is excruciating and constant. Describes as sharp, stabbing pains. Very localized, denies radiation. Feeling nauseous at this time.   Currently rates overall pain an 8/10. Alternating Tylenol  and Ibuprofen  at home with no improvement. Pt is concerned it may be her gallbladder given the amount of pain she is experiencing.

## 2024-03-27 NOTE — ED Notes (Signed)
 Patient is being discharged from the Urgent Care and sent to the Emergency Department via private vehicle . Per Rocky Mecum PA, patient is in need of higher level of care due to right upper abdominal pain x 2 days. Patient is aware and verbalizes understanding of plan of care.   Vitals:   03/27/24 1113  BP: 126/85  Pulse: 97  Resp: 18  Temp: 98.2 F (36.8 C)  SpO2: 98%   Pt refused EMS transport.

## 2024-03-27 NOTE — ED Triage Notes (Signed)
 Pt presents to ED from UC C/O RUQ pain and nausea X 2 days. Hx gallstones.

## 2024-03-27 NOTE — ED Provider Notes (Signed)
 [facility]    CSN: [id number] Arrival date & time: 03/27/24  1100      History   Chief Complaint Chief Complaint  Patient presents with   Abdominal Pain    Pain under right rib cage, breast bone. Persistent. Started Friday evening. Not indigestion or acid reflux. Feels like it may be my gallbladder - Entered by patient    HPI Lindsey [name] is a 48 y.o. female.   HPI  Discussed the use of AI scribe software for clinical note transcription with the patient, who gave verbal consent to proceed. The patient, with gallstones, presents with a suspected gallbladder attack.  The patient has a history of gallstones identified on an MRI performed for her spine. The current episode of pain began on Friday night and persisted throughout the day yesterday and into last night, nearly prompting a visit to the emergency room due to its severity. The pain is located under the right rib and radiates to the back and shoulder.  She has been able to drink fluids but is avoiding eating due to fear of exacerbating the pain. She experienced nausea this morning but has not vomited. There is no diarrhea or constipation, and bowel movements have not changed to clay color. She has noticed blood in her stools prior to her current symptoms, which she plans to address in a couple of weeks.  She experienced chills last night and this morning, but no fever was reported. She also reports chest pain that radiates on the right side from the abdomen, along with some difficulty breathing or chest tightness. The intensity of the pain was severe enough to almost cause fainting last night     Past Medical History:  Diagnosis Date   Other and unspecified disc disorder of lumbar region    torn lumbar disc?    Patient Active Problem List   Diagnosis Date Noted   Bipolar II disorder (HCC) 07/22/2018   Morbidly obese (HCC) 08/28/2016   Ovarian cyst, left 07/05/2015   Hirsutism 05/21/2015   Breakthrough  bleeding with IUD 05/21/2015   Pelvic pain in female 04/29/2012   Other and unspecified disc disorder of unspecified region 04/29/2012   PCOS (polycystic ovarian syndrome) 04/29/2012    Past Surgical History:  Procedure Laterality Date   CESAREAN SECTION     INTRAUTERINE DEVICE (IUD) INSERTION     mirena  removed & re-inserted 04-16-17   WISDOM TOOTH EXTRACTION      OB History     Gravida  2   Para  1   Term  1   Preterm      AB  1   Living  1      SAB      IAB      Ectopic      Multiple      Live Births  1            Home Medications    Prior to Admission medications   Medication Sig Start Date End Date Taking? Authorizing Provider  acetaminophen -codeine (TYLENOL  #3) 300-30 MG tablet Take 1 tablet by mouth every 8 (eight) hours as needed. 01/23/21   [provider]  ALPRAZolam  (XANAX ) 0.25 MG tablet Take 1 tablet (0.25 mg total) by mouth every 8 (eight) hours as needed. 09/01/23   Cottle, Lorene KANDICE Raddle., MD  Cholecalciferol (VITAMIN D3) 25 MCG (1000 UT) CAPS Take by mouth.    [provider]  Cyanocobalamin (VITAMIN B12) 1000 MCG TBCR 1  tablet    [provider]  Dextromethorphan -buPROPion  ER (AUVELITY ) 45-105 MG TBCR Take 1 tablet by mouth 2 (two) times daily. Patient taking differently: Take 1 tablet by mouth 2 (two) times daily. Taking 1 daily 09/01/23   Cottle, Lorene KANDICE Raddle., MD  lamoTRIgine  (LAMICTAL ) 150 MG tablet Take 1 tablet by mouth twice daily. 02/12/24   Cottle, Lorene KANDICE Raddle., MD  levonorgestrel  (MIRENA ) 20 MCG/24HR IUD 1 each by Intrauterine route once.    [provider]  lisdexamfetamine (VYVANSE ) 60 MG capsule Take 1 capsule (60 mg total) by mouth daily. 03/10/24   Cottle, Lorene KANDICE Raddle., MD  propranolol  (INDERAL ) 10 MG tablet Take 1 tablet (10 mg total) by mouth 3 (three) times daily as needed. 09/01/23   Cottle, Lorene KANDICE Raddle., MD  triamcinolone  ointment (KENALOG ) 0.1 % Apply 1 application. topically 2 (two) times daily as  needed (Rash). 09/26/21   Sheffield, Kelli R, PA-C  ZEPBOUND 12.5 MG/0.5ML Pen Inject 12.5 mg into the skin once a week.    [provider]    Family History Family History  Problem Relation Age of Onset   Diabetes Mother    Diabetes Father    Cancer Father        kidney   Cancer Paternal Grandfather        PROSTATE    Social History Social History   Tobacco Use   Smoking status: Every Day    Current packs/day: 0.75    Types: Cigarettes   Smokeless tobacco: Never  Vaping Use   Vaping status: Never Used  Substance Use Topics   Alcohol use: No   Drug use: No     Allergies   Celexa [citalopram]   Review of Systems Review of Systems  Constitutional:  Positive for chills and fatigue. Negative for fever.  Respiratory:  Positive for chest tightness. Negative for shortness of breath.   Cardiovascular:  Positive for chest pain (right sided chest pain).  Gastrointestinal:  Positive for abdominal pain, blood in stool and nausea. Negative for constipation, diarrhea and vomiting.     Physical Exam Triage Vital Signs ED Triage Vitals  Encounter Vitals Group     BP 03/27/24 1113 126/85     Girls Systolic BP Percentile --      Girls Diastolic BP Percentile --      Boys Systolic BP Percentile --      Boys Diastolic BP Percentile --      Pulse Rate 03/27/24 1113 97     Resp 03/27/24 1113 18     Temp 03/27/24 1113 98.2 F (36.8 C)     Temp Source 03/27/24 1113 Oral     SpO2 03/27/24 1113 98 %     Weight 03/27/24 1111 175 lb (79.4 kg)     Height 03/27/24 1111 5' 5 (1.651 m)     Head Circumference --      Peak Flow --      Pain Score 03/27/24 1111 8     Pain Loc --      Pain Education --      Exclude from Growth Chart --    No data found.  Updated Vital Signs BP 126/85 (BP Location: Right Arm)   Pulse 97   Temp 98.2 F (36.8 C) (Oral)   Resp 18   Ht 5' 5 (1.651 m)   Wt 175 lb (79.4 kg)   LMP 03/23/2024 (Within Days)   SpO2 98%   BMI 29.12 kg/m  Visual Acuity Right Eye Distance:   Left Eye Distance:   Bilateral Distance:    Right Eye Near:   Left Eye Near:    Bilateral Near:     Physical Exam Vitals reviewed.  Constitutional:      General: She is awake. She is in acute distress.     Appearance: Normal appearance. She is well-developed and well-groomed. She is not ill-appearing.     Comments: Pt appears to be in pain but is seated in exam chair and does not have difficulty with position changes or notable issues with breathing  HENT:     Head: Normocephalic and atraumatic.  Eyes:     General: Lids are normal. Gaze aligned appropriately.     Extraocular Movements: Extraocular movements intact.     Conjunctiva/sclera: Conjunctivae normal.  Cardiovascular:     Rate and Rhythm: Normal rate and regular rhythm.     Heart sounds: Normal heart sounds. No murmur heard.    No friction rub. No gallop.  Pulmonary:     Effort: Pulmonary effort is normal. No tachypnea or respiratory distress.     Breath sounds: Normal breath sounds. No decreased air movement. No decreased breath sounds, wheezing, rhonchi or rales.  Abdominal:     General: Abdomen is flat. Bowel sounds are normal.     Palpations: Abdomen is soft.     Tenderness: There is abdominal tenderness in the right upper quadrant. There is guarding. Positive signs include Murphy's sign.     Comments: Pt has pain with palpation of RUQ and obvious Murphy's sign along with guarding when touch is attempted near the RUQ  Neurological:     Mental Status: She is alert and oriented to person, place, and time.  Psychiatric:        Attention and Perception: Attention and perception normal.        Mood and Affect: Mood and affect normal.        Speech: Speech normal.        Behavior: Behavior normal. Behavior is cooperative.      UC Treatments / Results  Labs (all labs ordered are listed, but only abnormal results are displayed) Labs Reviewed - No data to  display  EKG   Radiology No results found.  Procedures Procedures (including critical care time)  Medications Ordered in UC Medications  ondansetron  (ZOFRAN -ODT) disintegrating tablet 4 mg (4 mg Oral Given 03/27/24 1117)    Initial Impression / Assessment and Plan / UC Course  I have reviewed the triage vital signs and the nursing notes.  Pertinent labs & imaging results that were available during my care of the patient were reviewed by me and considered in my medical decision making (see chart for details).      Final Clinical Impressions(s) / UC Diagnoses   Final diagnoses:  RUQ abdominal pain  Nausea without vomiting   Right upper quadrant abdominal pain, likely biliary colic Acute right upper quadrant abdominal pain likely due to biliary colic, persisting since Friday night. Gallstones documented on MRI- unable to confirm with chart review. Pain radiates to the back and shoulder, suggesting possible gallstone obstruction in the bile duct. Symptoms have not improved, indicating potential obstruction.  - Recommend emergency room visit for ultrasound or CT scan to assess for gallstone obstruction as well as intervention - Advise arranging transportation to the emergency room - Offer to call EMS if symptoms worsen- she declines EMS transportation and states she will go on her own via private vehicle.  Nausea and chills associated with biliary colic Nausea and chills present since last night, associated with biliary colic. No vomiting reported. Symptoms suggestive of systemic response to biliary obstruction.    Discharge Instructions      VISIT SUMMARY:  You came in today due to severe pain under your right rib that radiates to your back and shoulder, which started on Friday night. You also reported nausea, chills, and right-sided chest pain. You have a history of gallstones, and we suspect that you might be experiencing a gallbladder attack due to a possible gallstone  obstruction.  YOUR PLAN:  -RIGHT UPPER QUADRANT ABDOMINAL PAIN, LIKELY BILIARY COLIC: Biliary colic is severe pain caused by a gallstone temporarily blocking the bile duct. I recommend that you visit the emergency room for an ultrasound or CT scan to see if there is a gallstone obstruction. You should arrange transportation to the emergency room, and if your symptoms worsen please call 911 for assistance.   -NAUSEA AND CHILLS ASSOCIATED WITH BILIARY COLIC: Your nausea and chills are likely related to the gallbladder issue. These symptoms suggest your body is reacting to the possible gallstone obstruction.    INSTRUCTIONS:  Please go to the emergency room as soon as possible for further imaging and assessment. If your symptoms worsen, contact emergency medical services immediately.     ED Prescriptions   None    PDMP not reviewed this encounter.   Marylene Rocky BRAVO, PA-C 03/27/24 1151

## 2024-03-28 ENCOUNTER — Encounter (HOSPITAL_COMMUNITY)
Admission: EM | Disposition: A | Discharge status: Home / Self Care | Payer: Self-pay | Source: Ambulatory Visit | Attending: Emergency Medicine

## 2024-03-28 ENCOUNTER — Observation Stay (HOSPITAL_BASED_OUTPATIENT_CLINIC_OR_DEPARTMENT_OTHER): Admitting: Certified Registered Nurse Anesthetist

## 2024-03-28 ENCOUNTER — Observation Stay (HOSPITAL_COMMUNITY)

## 2024-03-28 ENCOUNTER — Observation Stay (HOSPITAL_COMMUNITY): Admitting: Certified Registered Nurse Anesthetist

## 2024-03-28 DIAGNOSIS — K819 Cholecystitis, unspecified: Secondary | ICD-10-CM | POA: Diagnosis not present

## 2024-03-28 HISTORY — PX: CHOLECYSTECTOMY: SHX55

## 2024-03-28 LAB — HIV ANTIBODY (ROUTINE TESTING W REFLEX): HIV Screen 4th Generation wRfx: NONREACTIVE

## 2024-03-28 SURGERY — LAPAROSCOPIC CHOLECYSTECTOMY WITH INTRAOPERATIVE CHOLANGIOGRAM
Anesthesia: General | Site: Abdomen

## 2024-03-28 MED ORDER — ACETAMINOPHEN 500 MG PO TABS: 1000.0000 mg | ORAL_TABLET | Freq: Three times a day (TID) | ORAL | Status: AC | PRN

## 2024-03-28 MED ORDER — OXYCODONE HCL 5 MG PO TABS: 5.0000 mg | ORAL_TABLET | Freq: Four times a day (QID) | ORAL | 0 refills | Status: AC | PRN

## 2024-03-28 MED ORDER — IBUPROFEN 400 MG PO TABS
600.0000 mg | ORAL_TABLET | Freq: Four times a day (QID) | ORAL | Status: DC | PRN
  Administered 2024-03-28: 600 mg via ORAL
  Filled 2024-03-28: qty 1

## 2024-03-28 MED ORDER — DEXAMETHASONE SODIUM PHOSPHATE 10 MG/ML IJ SOLN
INTRAMUSCULAR | Status: DC | PRN
  Administered 2024-03-28: 10 mg via INTRAVENOUS

## 2024-03-28 MED ORDER — BUPIVACAINE-EPINEPHRINE 0.25% -1:200000 IJ SOLN
INTRAMUSCULAR | Status: DC | PRN
  Administered 2024-03-28: 30 mL

## 2024-03-28 MED ORDER — ONDANSETRON HCL 4 MG/2ML IJ SOLN
INTRAMUSCULAR | Status: AC
  Filled 2024-03-28: qty 2

## 2024-03-28 MED ORDER — MIDAZOLAM HCL 5 MG/5ML IJ SOLN
INTRAMUSCULAR | Status: DC | PRN
  Administered 2024-03-28: 2 mg via INTRAVENOUS

## 2024-03-28 MED ORDER — SUCCINYLCHOLINE CHLORIDE 200 MG/10ML IV SOSY
PREFILLED_SYRINGE | INTRAVENOUS | Status: DC | PRN
  Administered 2024-03-28: 100 mg via INTRAVENOUS

## 2024-03-28 MED ORDER — AMISULPRIDE (ANTIEMETIC) 5 MG/2ML IV SOLN
INTRAVENOUS | Status: AC
  Filled 2024-03-28: qty 4

## 2024-03-28 MED ORDER — PROPOFOL 10 MG/ML IV BOLUS
INTRAVENOUS | Status: DC | PRN
  Administered 2024-03-28: 170 mg via INTRAVENOUS

## 2024-03-28 MED ORDER — MORPHINE SULFATE (PF) 2 MG/ML IV SOLN: 2.0000 mg | INTRAVENOUS | Status: DC | PRN

## 2024-03-28 MED ORDER — ROCURONIUM BROMIDE 10 MG/ML (PF) SYRINGE
PREFILLED_SYRINGE | INTRAVENOUS | Status: DC | PRN
  Administered 2024-03-28: 40 mg via INTRAVENOUS

## 2024-03-28 MED ORDER — BUPIVACAINE-EPINEPHRINE (PF) 0.25% -1:200000 IJ SOLN
INTRAMUSCULAR | Status: AC
  Filled 2024-03-28: qty 30

## 2024-03-28 MED ORDER — 0.9 % SODIUM CHLORIDE (POUR BTL) OPTIME
TOPICAL | Status: DC | PRN
  Administered 2024-03-28: 1000 mL

## 2024-03-28 MED ORDER — LAMOTRIGINE 100 MG PO TABS
300.0000 mg | ORAL_TABLET | Freq: Every morning | ORAL | Status: DC
  Administered 2024-03-28 – 2024-03-29 (×2): 300 mg via ORAL
  Filled 2024-03-28 (×2): qty 3

## 2024-03-28 MED ORDER — IOHEXOL 300 MG/ML  SOLN
INTRAMUSCULAR | Status: DC | PRN
  Administered 2024-03-28: 4 mL

## 2024-03-28 MED ORDER — AMISULPRIDE (ANTIEMETIC) 5 MG/2ML IV SOLN
10.0000 mg | Freq: Once | INTRAVENOUS | Status: AC | PRN
  Administered 2024-03-28: 10 mg via INTRAVENOUS

## 2024-03-28 MED ORDER — OXYCODONE HCL 5 MG PO TABS
ORAL_TABLET | ORAL | Status: AC
  Filled 2024-03-28: qty 1

## 2024-03-28 MED ORDER — HYDROMORPHONE HCL 1 MG/ML IJ SOLN
0.2500 mg | INTRAMUSCULAR | Status: DC | PRN
  Administered 2024-03-28 (×4): 0.5 mg via INTRAVENOUS

## 2024-03-28 MED ORDER — METHOCARBAMOL 500 MG PO TABS
500.0000 mg | ORAL_TABLET | Freq: Once | ORAL | Status: AC
  Administered 2024-03-28: 500 mg via ORAL

## 2024-03-28 MED ORDER — OXYCODONE HCL 5 MG/5ML PO SOLN: 5.0000 mg | Freq: Once | ORAL | Status: AC | PRN

## 2024-03-28 MED ORDER — DEXTROSE 5 % IV SOLN
INTRAVENOUS | Status: DC | PRN
  Administered 2024-03-28: 2 g via INTRAVENOUS

## 2024-03-28 MED ORDER — PROPOFOL 10 MG/ML IV BOLUS
INTRAVENOUS | Status: AC
  Filled 2024-03-28: qty 20

## 2024-03-28 MED ORDER — SUGAMMADEX SODIUM 200 MG/2ML IV SOLN
INTRAVENOUS | Status: DC | PRN
  Administered 2024-03-28: 200 mg via INTRAVENOUS

## 2024-03-28 MED ORDER — METHOCARBAMOL 500 MG PO TABS
ORAL_TABLET | ORAL | Status: AC
  Filled 2024-03-28: qty 1

## 2024-03-28 MED ORDER — TRAMADOL HCL 50 MG PO TABS
50.0000 mg | ORAL_TABLET | Freq: Four times a day (QID) | ORAL | Status: DC | PRN
  Filled 2024-03-28: qty 1

## 2024-03-28 MED ORDER — OXYCODONE HCL 5 MG PO TABS
5.0000 mg | ORAL_TABLET | Freq: Once | ORAL | Status: AC | PRN
  Administered 2024-03-28: 5 mg via ORAL

## 2024-03-28 MED ORDER — MIDAZOLAM HCL 2 MG/2ML IJ SOLN
INTRAMUSCULAR | Status: AC
  Filled 2024-03-28: qty 2

## 2024-03-28 MED ORDER — ACETAMINOPHEN 500 MG PO TABS
1000.0000 mg | ORAL_TABLET | Freq: Four times a day (QID) | ORAL | Status: DC
Start: 2024-03-29 — End: 2024-03-29

## 2024-03-28 MED ORDER — FENTANYL CITRATE PF 50 MCG/ML IJ SOSY
25.0000 ug | PREFILLED_SYRINGE | INTRAMUSCULAR | Status: DC | PRN
  Administered 2024-03-28 (×3): 50 ug via INTRAVENOUS

## 2024-03-28 MED ORDER — METOPROLOL TARTRATE 5 MG/5ML IV SOLN: 5.0000 mg | Freq: Four times a day (QID) | INTRAVENOUS | Status: DC | PRN

## 2024-03-28 MED ORDER — LACTATED RINGERS IV SOLN: INTRAVENOUS | Status: DC

## 2024-03-28 MED ORDER — FENTANYL CITRATE (PF) 250 MCG/5ML IJ SOLN
INTRAMUSCULAR | Status: AC
  Filled 2024-03-28: qty 5

## 2024-03-28 MED ORDER — INDOCYANINE GREEN 25 MG IV SOLR
5.0000 mg | Freq: Once | INTRAVENOUS | Status: AC
  Administered 2024-03-28: 5 mg via INTRAVENOUS

## 2024-03-28 MED ORDER — FENTANYL CITRATE PF 50 MCG/ML IJ SOSY
PREFILLED_SYRINGE | INTRAMUSCULAR | Status: AC
  Filled 2024-03-28: qty 3

## 2024-03-28 MED ORDER — IBUPROFEN 600 MG PO TABS: 600.0000 mg | ORAL_TABLET | Freq: Four times a day (QID) | ORAL | Status: AC | PRN

## 2024-03-28 MED ORDER — LIDOCAINE 2% (20 MG/ML) 5 ML SYRINGE
INTRAMUSCULAR | Status: DC | PRN
  Administered 2024-03-28: 60 mg via INTRAVENOUS

## 2024-03-28 MED ORDER — FENTANYL CITRATE (PF) 100 MCG/2ML IJ SOLN
INTRAMUSCULAR | Status: DC | PRN
  Administered 2024-03-28 (×2): 50 ug via INTRAVENOUS
  Administered 2024-03-28: 100 ug via INTRAVENOUS
  Administered 2024-03-28: 50 ug via INTRAVENOUS

## 2024-03-28 MED ORDER — LAMOTRIGINE 100 MG PO TABS: 300.0000 mg | ORAL_TABLET | Freq: Every morning | ORAL | Status: DC

## 2024-03-28 MED ORDER — DEXTROMETHORPHAN-BUPROPION ER 45-105 MG PO TBCR: 1.0000 | EXTENDED_RELEASE_TABLET | Freq: Every morning | ORAL | Status: DC

## 2024-03-28 MED ORDER — LISDEXAMFETAMINE DIMESYLATE 30 MG PO CAPS: 60.0000 mg | ORAL_CAPSULE | Freq: Every day | ORAL | Status: DC

## 2024-03-28 MED ORDER — HYDROMORPHONE HCL 1 MG/ML IJ SOLN
INTRAMUSCULAR | Status: AC
  Filled 2024-03-28: qty 2

## 2024-03-28 MED ORDER — ONDANSETRON HCL 4 MG/2ML IJ SOLN
INTRAMUSCULAR | Status: DC | PRN
  Administered 2024-03-28: 4 mg via INTRAVENOUS

## 2024-03-28 MED ORDER — OXYCODONE HCL 5 MG PO TABS: 5.0000 mg | ORAL_TABLET | ORAL | Status: DC | PRN

## 2024-03-28 MED ORDER — SODIUM CHLORIDE 0.9 % IV SOLN
INTRAVENOUS | Status: AC
  Filled 2024-03-28: qty 20

## 2024-03-28 MED ORDER — ACETAMINOPHEN 500 MG PO TABS
1000.0000 mg | ORAL_TABLET | Freq: Four times a day (QID) | ORAL | Status: DC
  Administered 2024-03-28: 1000 mg via ORAL
  Filled 2024-03-28 (×2): qty 2

## 2024-03-28 MED ORDER — CHLORHEXIDINE GLUCONATE 0.12 % MT SOLN
15.0000 mL | Freq: Once | OROMUCOSAL | Status: AC
  Administered 2024-03-28: 15 mL via OROMUCOSAL

## 2024-03-28 SURGICAL SUPPLY — 38 items
BAG COUNTER SPONGE SURGICOUNT (BAG) IMPLANT
BENZOIN TINCTURE PRP APPL 2/3 (GAUZE/BANDAGES/DRESSINGS) IMPLANT
BNDG ADH 1X3 SHEER STRL LF (GAUZE/BANDAGES/DRESSINGS) IMPLANT
CABLE HIGH FREQUENCY MONO STRZ (ELECTRODE) ×2 IMPLANT
CHLORAPREP W/TINT 26 (MISCELLANEOUS) ×2 IMPLANT
CLIP APPLIE 5 13 M/L LIGAMAX5 (MISCELLANEOUS) IMPLANT
CLIP APPLIE ROT 10 11.4 M/L (STAPLE) IMPLANT
CLIP LIGATING HEM O LOK PURPLE (MISCELLANEOUS) IMPLANT
CLIP LIGATING HEMO O LOK GREEN (MISCELLANEOUS) ×2 IMPLANT
COVER MAYO STAND XLG (MISCELLANEOUS) ×2 IMPLANT
COVER SURGICAL LIGHT HANDLE (MISCELLANEOUS) ×2 IMPLANT
DRAIN CHANNEL 19F RND (DRAIN) IMPLANT
DRAPE C-ARM 42X120 X-RAY (DRAPES) IMPLANT
ELECT REM PT RETURN 15FT ADLT (MISCELLANEOUS) ×2 IMPLANT
ENDOLOOP SUT PDS II 0 18 (SUTURE) IMPLANT
EVACUATOR SILICONE 100CC (DRAIN) IMPLANT
GLOVE BIOGEL PI IND STRL 7.0 (GLOVE) ×2 IMPLANT
GLOVE SURG SS PI 7.0 STRL IVOR (GLOVE) ×2 IMPLANT
GOWN STRL REUS W/ TWL LRG LVL3 (GOWN DISPOSABLE) ×2 IMPLANT
GRASPER SUT TROCAR 14GX15 (MISCELLANEOUS) IMPLANT
IRRIGATION SUCT STRKRFLW 2 WTP (MISCELLANEOUS) ×2 IMPLANT
KIT BASIN OR (CUSTOM PROCEDURE TRAY) ×2 IMPLANT
KIT TURNOVER KIT A (KITS) ×2 IMPLANT
NDL HYPO 22X1.5 SAFETY MO (MISCELLANEOUS) ×2 IMPLANT
NEEDLE HYPO 22X1.5 SAFETY MO (MISCELLANEOUS) ×1 IMPLANT
POUCH RETRIEVAL ECOSAC 10 (ENDOMECHANICALS) ×2 IMPLANT
SCISSORS LAP 5X35 DISP (ENDOMECHANICALS) ×2 IMPLANT
SET CHOLANGIOGRAPH MIX (MISCELLANEOUS) IMPLANT
SET TUBE SMOKE EVAC HIGH FLOW (TUBING) ×2 IMPLANT
SLEEVE Z-THREAD 5X100MM (TROCAR) ×4 IMPLANT
SPIKE FLUID TRANSFER (MISCELLANEOUS) ×2 IMPLANT
STRIP CLOSURE SKIN 1/2X4 (GAUZE/BANDAGES/DRESSINGS) IMPLANT
SUT ETHILON 2 0 PS N (SUTURE) IMPLANT
SUT MNCRL AB 4-0 PS2 18 (SUTURE) ×2 IMPLANT
TOWEL OR 17X26 10 PK STRL BLUE (TOWEL DISPOSABLE) ×2 IMPLANT
TRAY LAPAROSCOPIC (CUSTOM PROCEDURE TRAY) ×2 IMPLANT
TROCAR Z THREAD OPTICAL 12X100 (TROCAR) ×2 IMPLANT
TROCAR Z-THREAD OPTICAL 5X100M (TROCAR) ×2 IMPLANT

## 2024-03-28 NOTE — Discharge Instructions (Signed)

## 2024-03-28 NOTE — Plan of Care (Signed)
  Problem: Education: Goal: Knowledge of General Education information will improve Description: Including pain rating scale, medication(s)/side effects and non-pharmacologic comfort measures Outcome: Progressing   Problem: Health Behavior/Discharge Planning: Goal: Ability to manage health-related needs will improve Outcome: Progressing   Problem: Pain Managment: Goal: General experience of comfort will improve and/or be controlled Outcome: Progressing   Problem: Safety: Goal: Ability to remain free from injury will improve Outcome: Progressing

## 2024-03-28 NOTE — Transfer of Care (Signed)
 Immediate Anesthesia Transfer of Care Note  Patient: Lindsey [name]  Procedure(s) Performed: LAPAROSCOPIC CHOLECYSTECTOMY WITH INTRAOPERATIVE CHOLANGIOGRAM (Abdomen)  Patient Location: PACU  Anesthesia Type:General  Level of Consciousness: sedated, patient cooperative, and responds to stimulation  Airway & Oxygen Therapy: Patient Spontanous Breathing and Patient connected to face mask oxygen  Post-op Assessment: Report given to RN and Post -op Vital signs reviewed and unstable, Anesthesiologist notified  Post vital signs: Reviewed and stable  Last Vitals:  Vitals Value Taken Time  BP 157/99 03/28/24 10:11  Temp    Pulse 79 03/28/24 10:15  Resp 18 03/28/24 10:15  SpO2 100 % 03/28/24 10:15  Vitals shown include unfiled device data.  Last Pain:  Vitals:   03/28/24 1014  TempSrc:   PainSc: 10-Worst pain ever         Complications: No notable events documented.

## 2024-03-28 NOTE — Progress Notes (Signed)
 Pre Procedure note for inpatients:   Lindsey [name] has been scheduled for Procedure(s) with comments: LAPAROSCOPIC CHOLECYSTECTOMY WITH INTRAOPERATIVE CHOLANGIOGRAM (N/A) - ICG today. The various methods of treatment have been discussed with the patient. After consideration of the risks, benefits and treatment options the patient has consented to the planned procedure.   The patient has been seen and labs reviewed. There are no changes in the patient's condition to prevent proceeding with the planned procedure today.  Recent labs:  Lab Results  Component Value Date   WBC 8.6 03/27/2024   HGB 14.1 03/27/2024   HCT 41.7 03/27/2024   PLT 241 03/27/2024   GLUCOSE 82 03/27/2024   CHOL 183 04/10/2021   TRIG 126 04/10/2021   HDL 34 (L) 04/10/2021   LDLCALC 125 (H) 04/10/2021   ALT 18 03/27/2024   AST 16 03/27/2024   NA 139 03/27/2024   K 3.9 03/27/2024   CL 105 03/27/2024   CREATININE 0.62 03/27/2024   BUN 10 03/27/2024   CO2 23 03/27/2024   TSH 1.84 03/05/2021    Herlene Beverley Bureau, MD 03/28/2024 7:42 AM

## 2024-03-28 NOTE — Op Note (Signed)
 PATIENT:  Lindsey [name]  48 y.o. female  PRE-OPERATIVE DIAGNOSIS:  CHOLECYSITIS  POST-OPERATIVE DIAGNOSIS:  CHOLECYSITIS  PROCEDURE:  Procedure(s): LAPAROSCOPIC CHOLECYSTECTOMY WITH INTRAOPERATIVE CHOLANGIOGRAM   SURGEON:  Hayato Guaman, Herlene Righter, MD   ASSISTANT: none  ANESTHESIA:   local and general  Indications for procedure: [name] is a 48 y.o. female with symptoms of Abdominal pain and Nausea and vomiting consistent with gallbladder disease, Confirmed by ultrasound.  Description of procedure: The patient was brought into the operative suite, placed supine. Anesthesia was administered with endotracheal tube. Patient was strapped in place and foot board was secured. All pressure points were offloaded by foam padding. The patient was prepped and draped in the usual sterile fashion.  A periumbilical incision was made and optical entry was used to enter the abdomen. 2 5 mm trocars were placed on in the right lateral space on in the right subcostal space. A 12mm trocar was placed in the subxiphoid space. Marcaine  was infused to the subxiphoid space and lateral upper right abdomen in the transversus abdominis plane. Next the patient was placed in reverse trendelenberg. The gallbladder appeareddilated and acutely inflamed. Omentum was adhered to the gallbladder and was taken down with cautery/blunt dissection. ICG dye was used to localize the CBD area and avoid dissection around it.  The gallbladder was retracted cephalad and lateral. The peritoneum was reflected off the infundibulum working lateral to medial. Cystic duct did show ICG dye presence. The cystic duct and cystic artery were identified and further dissection revealed a critical view, due to concern for choledocholithiasis a cholangiogram was performed with ductotomy and cook catheter passed through a separate subcostal stab incision. Normal ductal anatomy was seen and contrast quickly entered the duodenum. The cystic duct and  cystic artery were doubly clipped and ligated.   The gallbladder was removed off the liver bed with cautery. The Gallbladder was placed in a specimen bag. The gallbladder fossa was irrigated and hemostasis was applied with cautery. The gallbladder was removed via the 12mm trocar. Loose stones and sludge were removed with suction. The fascial defect was closed with interrupted 0 vicryl suture. Pneumoperitoneum was removed, all trocar were removed. All incisions were closed with 4-0 monocryl subcuticular stitch. The patient woke from anesthesia and was brought to PACU in stable condition. All counts were correct  Findings: acute cholecystitis  Specimen: gallbladder  Blood loss: 30 ml  Local anesthesia: 30 ml Marcaine   Complications: none  PLAN OF CARE: Admit to inpatient   PATIENT DISPOSITION:  PACU - hemodynamically stable.   Herlene Righter [facility], [place]

## 2024-03-28 NOTE — Anesthesia Procedure Notes (Signed)
 Procedure Name: Intubation Date/Time: 03/28/2024 9:00 AM  Performed by: Cindie Charleen PARAS, CRNAPre-anesthesia Checklist: Patient identified, Emergency Drugs available, Suction available, Patient being monitored and Timeout performed Patient Re-evaluated:Patient Re-evaluated prior to induction Oxygen Delivery Method: Circle system utilized Preoxygenation: Pre-oxygenation with 100% oxygen Induction Type: IV induction and Rapid sequence Laryngoscope Size: Mac and 3 Grade View: Grade I Tube type: Oral Tube size: 7.0 mm Number of attempts: 1 Airway Equipment and Method: Stylet Placement Confirmation: ETT inserted through vocal cords under direct vision, positive ETCO2 and breath sounds checked- equal and bilateral Secured at: 21 cm Tube secured with: Tape Dental Injury: Teeth and Oropharynx as per pre-operative assessment

## 2024-03-28 NOTE — Progress Notes (Signed)
 Patient seen for PM check.  Patient doing well post operatively. She reports some pain at her incisions that is well controlled with medications currently. She has been able to eat some mac and cheese without n/v. Doesn't have much of an appetite though and wants to order something different to eat. She has been able to ambulate in the room. Voiding.   On exam her abdomen is soft, no distension, appropriately tender around laparoscopic incisions, no rigidity or guarding and otherwise NT, +BS. Incisions with glue intact appears well and are without drainage, bleeding, or signs of infection   Discussed possible discharge this evening vs overnight observation with the patient. She would like to go home.  If patient tolerates her next meal, will plan for discharge this evening. Discussed discharge instructions, restrictions and return/call back precautions.   Lindsey [name] , [facility] 03/28/2024, 4:09 PM Please see Amion for pager number during day hours 7:00am-4:30pm

## 2024-03-28 NOTE — Anesthesia Preprocedure Evaluation (Addendum)
 Anesthesia Evaluation  Patient identified by MRN, date of birth, ID band Patient awake    Reviewed: Allergy & Precautions, NPO status , Patient's Chart, lab work & pertinent test results  History of Anesthesia Complications Negative for: history of anesthetic complications  Airway Mallampati: III  TM Distance: >3 FB Neck ROM: Full    Dental  (+) Dental Advisory Given   Pulmonary neg shortness of breath, neg sleep apnea, neg COPD, neg recent URI, Current Smoker (smokes 1 ppd) and Patient abstained from smoking.   Pulmonary exam normal breath sounds clear to auscultation       Cardiovascular negative cardio ROS  Rhythm:Regular Rate:Normal     Neuro/Psych  PSYCHIATRIC DISORDERS   Bipolar Disorder   negative neurological ROS     GI/Hepatic Neg liver ROS,neg GERD  ,,Cholecystitis    Endo/Other  negative endocrine ROS    Renal/GU negative Renal ROS     Musculoskeletal   Abdominal   Peds  Hematology negative hematology ROS (+) Lab Results      Component                Value               Date                      WBC                      8.6                 03/27/2024                HGB                      14.1                03/27/2024                HCT                      41.7                03/27/2024                MCV                      94.3                03/27/2024                PLT                      241                 03/27/2024              Anesthesia Other Findings Last Zepbound: 03/17/2024  Reproductive/Obstetrics PCOS                              Anesthesia Physical Anesthesia Plan  ASA: 2  Anesthesia Plan: General   Post-op Pain Management:    Induction: Intravenous and Rapid sequence  PONV Risk Score and Plan: 2 and Ondansetron , Dexamethasone  and Treatment may vary due to age or medical condition  Airway Management Planned: Oral ETT  Additional Equipment:    Intra-op Plan:  Post-operative Plan: Extubation in OR  Informed Consent: I have reviewed the patients History and Physical, chart, labs and discussed the procedure including the risks, benefits and alternatives for the proposed anesthesia with the patient or authorized representative who has indicated his/her understanding and acceptance.     Dental advisory given  Plan Discussed with: CRNA and Anesthesiologist  Anesthesia Plan Comments: (Risks of general anesthesia discussed including, but not limited to, sore throat, hoarse voice, chipped/damaged teeth, injury to vocal cords, nausea and vomiting, allergic reactions, lung infection, heart attack, stroke, and death. All questions answered. )         Anesthesia Quick Evaluation

## 2024-03-28 NOTE — Progress Notes (Signed)
   03/28/24 1411  TOC Brief Assessment  Insurance and Status Reviewed  Patient has primary care physician Yes  Home environment has been reviewed single family home  Prior level of function: independent  Prior/Current Home Services No current home services  Social Drivers of Health Review SDOH reviewed no interventions necessary  Readmission risk has been reviewed Yes  Transition of care needs no transition of care needs at this time    Signed: Heather Saltness, MSW, LCSW Clinical Social Worker Inpatient Care Management 03/28/2024 2:11 PM

## 2024-03-28 NOTE — Anesthesia Postprocedure Evaluation (Signed)
 Anesthesia Post Note  Patient: Lindsey [name]  Procedure(s) Performed: LAPAROSCOPIC CHOLECYSTECTOMY WITH INTRAOPERATIVE CHOLANGIOGRAM (Abdomen)     Patient location during evaluation: PACU Anesthesia Type: General Level of consciousness: awake Pain management: pain level controlled Vital Signs Assessment: post-procedure vital signs reviewed and stable Respiratory status: spontaneous breathing, nonlabored ventilation and respiratory function stable Cardiovascular status: blood pressure returned to baseline and stable Postop Assessment: no apparent nausea or vomiting Anesthetic complications: no   No notable events documented.  Last Vitals:  Vitals:   03/28/24 1200 03/28/24 1220  BP: (!) 140/81 (!) 163/89  Pulse: 86 (!) 58  Resp: (!) 29 16  Temp:  36.9 C  SpO2: 96% 99%    Last Pain:  Vitals:   03/28/24 1226  TempSrc:   PainSc: 8                  Delon Aisha Arch

## 2024-03-28 NOTE — Plan of Care (Signed)

## 2024-03-28 NOTE — Progress Notes (Signed)
 Dr Curvin notified pt's pain score was a 6 and morphine  ordered for a scale of 7-10.  There was no order for pain meds on scales of 1-6.  Dr Curvin said he disagrees with the pain score and stated that I was to give morphine  1-2mg  prn q 1 hr for all pain scores.  Press photographer and [facility] notified.  Pt given morphine  ordered for pain score of 6.

## 2024-03-28 NOTE — Plan of Care (Signed)

## 2024-03-29 ENCOUNTER — Encounter (HOSPITAL_COMMUNITY): Payer: Self-pay | Admitting: General Surgery

## 2024-03-29 LAB — SURGICAL PATHOLOGY

## 2024-03-29 NOTE — Discharge Summary (Signed)
 Patient ID: MYLENE BOW [id number] 08-17-1975 48 y.o.  Admit date: 03/27/2024 Discharge date: 03/29/2024  Admitting Diagnosis: Cholelithiasis with cholecystitis   Discharge Diagnosis S/p laparoscopic cholecystectomy with IOC by Dr. Stevie on 03/28/24  Consultants None  HPI: The patient is a 48 year old white female who presents with abdominal pain that started on Friday. The pain is located in her right upper quadrant. The pain has been associated with some nausea and vomiting. She came to the emergency department where an ultrasound showed that she does have stones in her gallbladder and some mild gallbladder wall thickening. Her liver functions were normal. She reports that she is known about her gallstones for quite some time. She does smoke about a pack of cigarettes a day.   Procedures Dr. Stevie - 03/28/24 Laparoscopic Cholecystectomy with [facility]  Hospital Course:  The patient was admitted and underwent a laparoscopic cholecystectomy with IOC. IOC negative. LFT's wnl. The patient tolerated the procedure well.  On POD 1, the patient was tolerating a regular diet, voiding well, mobilizing, and pain was controlled with oral pain medications.  The patient was stable for DC home at this time with appropriate follow up made. Discussed discharge instructions, restrictions and return/call back precautions.   Physical Exam: Gen:  Alert, NAD, pleasant Card:  Reg Pulm:  Rate and effort normal Abd: Soft, no distension, appropriately tender around laparoscopic incisions, no rigidity or guarding and otherwise NT, +BS. Incisions with glue intact appears well and are without drainage, bleeding, or signs of infection  Psych: A&Ox3    Allergies as of 03/29/2024       Reactions   Citalopram Other (See Comments)   Tremors        Medication List     TAKE these medications    4-Way Fast Acting 1 % nasal spray Generic drug: phenylephrine Place 1 drop into both nostrils every 6  (six) hours as needed for congestion.   acetaminophen  500 MG tablet Commonly known as: TYLENOL  Take 2 tablets (1,000 mg total) by mouth every 8 (eight) hours as needed.   ALPRAZolam  0.25 MG tablet Commonly known as: XANAX  Take 1 tablet (0.25 mg total) by mouth every 8 (eight) hours as needed.   Auvelity  45-105 MG Tbcr Generic drug: Dextromethorphan -buPROPion  ER Take 1 tablet by mouth 2 (two) times daily. What changed: when to take this   ibuprofen  600 MG tablet Commonly known as: ADVIL  Take 1 tablet (600 mg total) by mouth every 6 (six) hours as needed for up to 5 days for mild pain (pain score 1-3).   lamoTRIgine  150 MG tablet Commonly known as: LAMICTAL  Take 1 tablet by mouth twice daily. What changed:  how much to take when to take this   levonorgestrel  20 MCG/24HR IUD Commonly known as: MIRENA  1 each by Intrauterine route once.   lisdexamfetamine 60 MG capsule Commonly known as: VYVANSE  Take 1 capsule (60 mg total) by mouth daily.   oxyCODONE  5 MG immediate release tablet Commonly known as: Oxy IR/ROXICODONE  Take 1 tablet (5 mg total) by mouth every 6 (six) hours as needed for breakthrough pain.   propranolol  10 MG tablet Commonly known as: INDERAL  Take 1 tablet (10 mg total) by mouth 3 (three) times daily as needed.   triamcinolone  ointment 0.1 % Commonly known as: KENALOG  Apply 1 application. topically 2 (two) times daily as needed (Rash).   Zepbound 12.5 MG/0.5ML Pen Generic drug: tirzepatide Inject 12.5 mg into the skin every Thursday.  Follow-up Information     Cage Gupton, Puja Gosai, PA-C Follow up on 04/19/2024.   Specialty: General Surgery Why: 11:15am. Please bring a copy of your photo ID, insurance card and arrive 30 minutes prior to your appointment for paperwork. Contact information: [address] [place] [address] [phone]                 Signed: Ozell CHRISTELLA Shaper, [facility]  Surgery 03/29/2024, 10:27 AM Please see Amion for pager number during day hours 7:00am-4:30pm

## 2024-04-12 ENCOUNTER — Other Ambulatory Visit: Payer: Self-pay

## 2024-04-12 ENCOUNTER — Telehealth: Payer: Self-pay | Admitting: Psychiatry

## 2024-04-12 DIAGNOSIS — F902 Attention-deficit hyperactivity disorder, combined type: Secondary | ICD-10-CM

## 2024-04-12 MED ORDER — LISDEXAMFETAMINE DIMESYLATE 60 MG PO CAPS: 60.0000 mg | ORAL_CAPSULE | Freq: Every day | ORAL | 0 refills | Status: DC

## 2024-04-12 NOTE — Telephone Encounter (Signed)
 Pt called asking for a refill on her vyvanse  60 mg. Pharmacy is [facility] on [address] in [place]. Next appt is 10/23

## 2024-04-12 NOTE — Telephone Encounter (Signed)
 Pended

## 2024-05-03 ENCOUNTER — Telehealth: Payer: Self-pay

## 2024-05-03 NOTE — Telephone Encounter (Signed)
 PA Auvelity  45-105 mg #60/30 day with [employer], approved 05/03/24-05/03/25 [id number]

## 2024-05-12 ENCOUNTER — Telehealth: Payer: Self-pay | Admitting: Psychiatry

## 2024-05-12 ENCOUNTER — Other Ambulatory Visit: Payer: Self-pay

## 2024-05-12 DIAGNOSIS — F902 Attention-deficit hyperactivity disorder, combined type: Secondary | ICD-10-CM

## 2024-05-12 MED ORDER — LISDEXAMFETAMINE DIMESYLATE 60 MG PO CAPS: 60.0000 mg | ORAL_CAPSULE | Freq: Every day | ORAL | 0 refills | Status: DC

## 2024-05-12 NOTE — Telephone Encounter (Signed)
 Geryl called and said that she needs a refill on her vyvanse  60 mg. Pharmacy is [facility] on [address] in [place]. Next appt 10/27

## 2024-05-12 NOTE — Telephone Encounter (Signed)
 Pended

## 2024-05-16 ENCOUNTER — Ambulatory Visit: Admitting: Psychiatry

## 2024-05-16 ENCOUNTER — Encounter: Payer: Self-pay | Admitting: Psychiatry

## 2024-05-16 DIAGNOSIS — F3181 Bipolar II disorder: Secondary | ICD-10-CM | POA: Diagnosis not present

## 2024-05-16 DIAGNOSIS — F902 Attention-deficit hyperactivity disorder, combined type: Secondary | ICD-10-CM

## 2024-05-16 MED ORDER — LAMOTRIGINE 150 MG PO TABS: 275.0000 mg | ORAL_TABLET | Freq: Every day | ORAL | 1 refills | Status: DC

## 2024-05-16 MED ORDER — LISDEXAMFETAMINE DIMESYLATE 60 MG PO CAPS: 60.0000 mg | ORAL_CAPSULE | Freq: Every day | ORAL | 0 refills | Status: AC

## 2024-05-16 NOTE — Progress Notes (Signed)
 Lindsey [name] [id number] 1976-01-09 48 y.o.   Subjective:   Patient ID:  Lindsey [name] is a 48 y.o. (DOB April 08, 1976) female.  Chief Complaint:  Chief Complaint  Patient presents with   Follow-up   Medication Reaction    Lindsey [name] presents to today for follow-up of mood and anxiety.  When seen April 27th, 2020.  She was hypomanic at the time and having insomnia.  We increased Xanax  1 mg nightly and she was encouraged to increase oxcarbazepine  to 600 mg nightly to help with the hypomania.  She is fairly sensitive to mood stabilizers and has failed several.  She returned in June reporting hypomania resolved.  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  Finally ended the trauma bond and is better.  Trileptal  and Xanax  at night is better but only taking 1/2 of 300mg  Trileptal  bc sleepiness from 300 mg hs.  She's taking Xanax  0.5 mg AM and HS.  Getting a lot done.  Problematic boss got fired for performance problems.  No meds were changed at the June visit.  seen October 2020 the following was noted:She is now hypomanic with no particular trigger.  This may be associated with a tendency toward spring hypomania and bipolar patients. Chronic compliance problems with addition of meds.  Rec restart Trileptal  150 mg HS. This is off label for bipolar mania or hypomania.  She is fairly sensitive to mood stabilizers historically.  She does not want weight gain.   November 17, 2019 appointment, the following is noted: Been working on self growth and taking advantages of resources on internet and self esteem.  Pleased with meds. Not taken Trileptal .   Wonders if she has borderline pd bc splitting and black and white thinking.  Mood swings can be rapid from 30 min to 3 days.  Also triggered by abandonment. Chronic struggles with intrusive mother who she feels triggers her. Panic triggered only with one person.  Social anxiety is less. Working from home helps. Patient reports  stable mood and denies depressed or irritable moods.    Patient denies difficulty with sleep initiation or maintenance. Denies appetite disturbance.  Patient reports that energy and motivation have been good.  Patient denies any difficulty with concentration.  Patient denies any suicidal ideation. Plan no med changes  03/15/20 appt. With the following noted:   Wrote her out of work for 3 weeks DT multiple stressors with mental health overload including father of child facing jail then mistake at work.  Intense sensitivity to failure.  Couldn't eat and lost 13# in 3 weeks.  Needed break from work to manage stress.  Break was very helpful at restoration. Now doing OK.   Working from home has been good for her.  May ask for excuse to work part of the time from home. Started B12 and D3 which helped energy.  Not overly depressed.  No mood swings.  Incredible fear of abandonment also flared up in rel with current BF. Patient reports stable mood and denies depressed or irritable moods.   Patient denies difficulty with sleep initiation or maintenance.  NM if naps.  Denies appetite disturbance.  Patient reports that energy and motivation have been good.  Patient denies any difficulty with concentration.  Patient denies any suicidal ideation. Plan: No med changes  11/19/2020 appointment with the following noted: Doing OK.  Meds working fine.  Doing Ok spiritually. Concern is so fatigued.  Not sure the cause.  Wonders about vitamin  deficiency.  Naps when she can.  Not like a depressed fatigue. Sleep is OK.  Average 5 hours nightly during the week.  Partly bc it's quiet. In this pattern for a month or so. Big project at work requiring all attention and effort during the day. 20% raise. Drinks caffeine  All day and has for years.  03/27/2021 appointment with the following noted: Doing alright.  Still work stress.  Company bought out.  So expects to get laid off.   Takes Xanax  0.5 mg AM and then 0.25 prn about  2-3 times per month.  Reaches for propranolol  first.  Inderall helped anxiety a whole lot. Stress M recently psychotic and hospitalized and has Poor coping and severe fear of abandoment.. Pt is inconsistent and maybe PMS related and may nap 2-3 hours.  May sleep to shut off the world for a little. Overall is doing OK and at baseline.  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   Still on lamotrigine  150 AM and 300 HS. No SE. Plan: Be careful with caffeine bc anxiety. No med changes indicated  08/15/2021 appointment with the following noted: Situational depression.  Got a raise and not laid off.   Position with corporate team and permanent now.  Will be different after 18 years at [employer].   Still on lamotrigine  150 AM and 300 HS.. Stable depression and working on herself.  Getting better. Want to get stuff done around the house but hard to make herself do it. Sleep pattern terrible with naps and split schedule but getting 8 hours of sleep.  Likes staying up late to have quiet.   D will be 13 in May.  Hates [employer] and declining grades.    Needs transfer.   No concerns about meds. Bad panic in November with trigger of seeing someone with residual sx for a week.  Worst in a long time.  Was trauma trigger. Plan: No med changes  12/18/2021 appointment with the following noted: Doing good.  A lot of stress since here and managed well.  Worked on attachment issues in therapy.   Changed Xnanx to 0.5 mg 1/2 BID and it's helped to split it out.  Helped anxiety to do that. Taking Lindsey [name]'s father to court for support and custody. Lindsey [name] a lot of mental health problems including anxiety. Working on losing weight counting calories. Lost 18#. Work from home FT good. Patient reports stable mood and denies depressed or irritable moods.   Patient denies difficulty with sleep initiation or maintenance. Denies appetite disturbance.  Patient reports that  energy and motivation have been good.  Patient denies any difficulty with concentration.  Patient denies any suicidal ideation.  04/21/22 appt noted Summer stress with anxierty overwhelmed.  Not managed with Xanax  and propranolol .  RX clonidine  off label.  Talked with friend and never tried it bc it got better. Anxity pretty good now.  Was taking propranolol  30 mg daily and now 20 mg daily. Some cycling of fatigue and napping.  A little irritability.  No mania. Tolerating meds. Xanax  0.25 mg AM.  Only. Sleep schedule not always regular. Positives work raise.   Tori into charter school and pleased with change.  08/26/22 appt noted: Something is wrong. Mid Dec had to take 2 days off work bc depressed and burned out.  Cannot seem to pull up out of it.  Not sad but executive function freeze with amotivation.  Trouble with focus.  Can't pull herself out of it.  Enjoyment 4/10.  Affecting work.  Not much anxiety.  Sleep pattern erratic with average 5 hours and hard to make herself go to bed bc night is her free time.  Normal 7-8 hours.  Some naps. Plan: Wellbutrin  trial  10/28/22 appt noted: Got up to 300 XL wellbutrin  with night sweats that eventually stopped.  Mixed feels about it.  SE Problems getting words out.  Having wt gain.  Didn't improve energy or motivation.   Smoking comes and goes but stopped smoking pot with it.  And has a negative feeling on pot now.  None for a month.   Not jittery or edgy now and was jaw clenching but resolved. No sig sadness.    Burnt a bridge with someone that was necessary.  Released some anger.   Getting enough sleep. Plan: Yes modafinil  100 to 200 mg AM  01/27/23 appt noted: Increased modafinil  400 mg AM.  No SE.  She thinks it helped to increase it  Continues Wellbutrin  XL 150 AM, lamotrigine  150 AM and 300 PM. Has desire to do things but poor inititative and productivity.  Doing bare minimum.  Not sad.  BR is reflective of mental health but it is a mess.    Not tired. No swings. Plan: Switch Wellbutrin  to Auvelity  1 in the AM for 2 weeks and if no response then increase to twice daily for initiative and motivaltion. modafinil  200 to 400 mg AM  06/02/23 appt noted: Psych med: Auvelity  AM, no Wellbutrin , lamotrigine  150 AM and 300 PM, modafinil  200 mg am and noon, propranolol  10 BID prn anxiety. No SE and within 3-4 days noticed benefit with Auvelity  with mood.   No Xanax  needed lately.  For over a month. Propranolol  prn anticipating meetings.  Still burned out from work.  Falls over in taking care of the house.  But otherwise not sad.  Not as worried.   Satisfied.   Needs to be better caring for he room and home.  Making some progress.   Lost 40# on Zepbound.  Lowest wt as an adult and 100#.    09/01/23 appt noted: Psych med: Auvelity  1 in the AM, lamotrigine  150 AM and 150 PM, modafinil  200 mg am and noon, propranolol  10 BID prn anxiety, added B12 2000 mcg level and helped energy. Reduced lamotrigine  without a problem so far but gets a little HA initially.   Added B12 helped energy noticeably.   Still some lack of motivation but not terrible. Work is going ok. Back MRI today.   Plan no changes  12/31/23 appt noted: Psych med: Auvelity  1 in the AM, lamotrigine  300 AM, modafinil  200 mg am and noon, propranolol  10 BID prn anxiety, added B12 2000 mcg level and helped energy. Promotion at work to [employer] level.  Back with [employer]. Found out in March.  Really likes her boss.   Mood on an up right now.  Went for awhile severely depressed to abnormal degree but maybe environmental.   Wondered about second Auvelity  only about a week,  but kept forgetting it.   Barely managing what needs to be done.  Losing focus at work.  Struggling with motivation. Sleep is ok now.  When dep for 6 weeks was excessive sleep.   Around Sunday mood picked back up.   Bad shopping addiction.  Too much stuff in the house.  For a long time.   Plan: DC modafinil  and  switch to Concerta  54  mg .  She likely has ADD plus it could help off label for dep.  03/10/24 appt noted:  Med: Concerta  54 AM, Auvelity  1 in the AM, lamotrigine  300 AM, propranolol  10 BID prn anxiety, added B12 2000 mcg level and helped energy. 5-6 hours of effect from Concerta .  It helps focus.  Works better right before work.  Can hyperfocus with it but not always on right things. Also can still procrastinate.  But is overall more productive. Maybe a little agitation if taken on empty stomach.   Baseline seems flat.  Bored with work. Mild dep.  No mood swings.   No SE Plan: DC Concerta  and switch to Vyvanse  for longer duration.  05/16/24 appt noted:  Med: switch Vyvanse  60 AM, Auvelity  1 in the AM, lamotrigine  275 AM, propranolol  10 BID prn anxiety, added B12 2000 mcg level and helped energy. Reduced lamotrigine  to 275 mg daily without a px and maybe better energy. Longer effect with Vyvanse  vs concerta .  Benefit. NO SE Mood good and steady.   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    Xanax  more calming than the others.  Metta is 64 yo.  Her father is alcoholic.  Last hypomania was November 2018 and then again April 2018.  Past Psychiatric Medication Trials: Poor response SSRIs,  Effexor, Paxil, sertraline itchy,  Serzone,  Wellbutrin , Vraylar, olanzapine sedation, Latuda fogginess, risperidone, Abilify, Trileptal  300 tired, lamotrigine ,  lithium, propranolol ,    Klonopin, Ativan, Xanax , buspirone nausea NAC, Concerta  54 short effect Modafinil  200  Strattera nausea,   Review of Systems:  Review of Systems  Constitutional:  Negative for fatigue.  Cardiovascular:  Negative for palpitations.  Neurological:  Negative for tremors and weakness.  Psychiatric/Behavioral:  Negative for agitation, behavioral problems, confusion, decreased concentration, dysphoric mood, hallucinations, self-injury, sleep disturbance and suicidal ideas. The patient is not nervous/anxious and is not hyperactive.      Medications: I have reviewed the patient's current medications.  Current Outpatient Medications  Medication Sig Dispense Refill   4-WAY FAST ACTING 1 % nasal spray Place 1 drop into both nostrils every 6 (six) hours as needed for congestion.     acetaminophen  (TYLENOL ) 500 MG tablet Take 2 tablets (1,000 mg total) by mouth every 8 (eight) hours as needed.     ALPRAZolam  (XANAX ) 0.25 MG tablet Take 1 tablet (0.25 mg total) by mouth every 8 (eight) hours as needed. 90 tablet 0   Dextromethorphan -buPROPion  ER (AUVELITY ) 45-105 MG TBCR Take 1 tablet by mouth 2 (two) times daily. (Patient taking differently: Take 1 tablet by mouth in the morning.) 60 tablet 3   levonorgestrel  (MIRENA ) 20 MCG/24HR IUD 1 each by Intrauterine route once.     oxyCODONE  (OXY IR/ROXICODONE ) 5 MG immediate release tablet Take 1 tablet (5 mg total) by mouth every 6 (six) hours as needed for breakthrough pain. 15 tablet 0   propranolol  (INDERAL ) 10 MG tablet Take 1 tablet (10 mg total) by mouth 3 (three) times daily as needed. 270 tablet 1   triamcinolone  ointment (KENALOG ) 0.1 % Apply 1 application. topically 2 (two) times daily as needed (Rash). 454 g 11   ZEPBOUND 12.5 MG/0.5ML Pen Inject 12.5 mg into the skin every Thursday.     lamoTRIgine  (LAMICTAL ) 150 MG tablet Take 2 tablets (300 mg total) by mouth daily. 135 tablet 1   lisdexamfetamine (VYVANSE ) 60 MG capsule Take 1 capsule (60 mg total) by mouth daily. 90 capsule 0   No current facility-administered medications for this  visit.    Medication Side Effects: None  Allergies:  Allergies  Allergen Reactions   Citalopram Other (See Comments)    Tremors    Past Medical History:  Diagnosis Date   Other and unspecified disc disorder of lumbar region    torn lumbar disc?    Family History  Problem Relation Age of Onset   Diabetes Mother    Diabetes Father    Cancer Father        kidney   Cancer Paternal Grandfather        PROSTATE    Social  History   Socioeconomic History   Marital status: Single    Spouse name: Not on file   Number of children: Not on file   Years of education: Not on file   Highest education level: Not on file  Occupational History   Not on file  Tobacco Use   Smoking status: Every Day    Current packs/day: 0.75    Types: Cigarettes   Smokeless tobacco: Never  Vaping Use   Vaping status: Never Used  Substance and Sexual Activity   Alcohol use: No   Drug use: No   Sexual activity: Yes    Birth control/protection: I.U.D., Condom    Comment: 1st intercourse- 24, partners- 5, mirena  inserted 04-16-17  Other Topics Concern   Not on file  Social History Narrative   Not on file   Social Drivers of Health   Financial Resource Strain: Not on file  Food Insecurity: No Food Insecurity (03/27/2024)   Hunger Vital Sign    Worried About Running Out of Food in the Last Year: Never true    Ran Out of Food in the Last Year: Never true  Transportation Needs: No Transportation Needs (03/27/2024)   PRAPARE - Administrator, Civil Service (Medical): No    Lack of Transportation (Non-Medical): No  Physical Activity: Not on file  Stress: Not on file  Social Connections: Not on file  Intimate Partner Violence: Not At Risk (03/27/2024)   Humiliation, Afraid, Rape, and Kick questionnaire    Fear of Current or Ex-Partner: No    Emotionally Abused: No    Physically Abused: No    Sexually Abused: No    Past Medical History, Surgical history, Social history, and Family history were reviewed and updated as appropriate.   Please see review of systems for further details on the patient's review from today.   Objective:   Physical Exam:  There were no vitals taken for this visit.  Physical Exam Constitutional:      Appearance: She is obese.  Musculoskeletal:        General: No deformity.  Neurological:     Mental Status: She is alert.     Cranial Nerves: No dysarthria.     Motor: No weakness or  tremor.     Gait: Gait normal.  Psychiatric:        Attention and Perception: Attention and perception normal.        Mood and Affect: Mood is not anxious or depressed.        Speech: Speech is not rapid and pressured, slurred or tangential.        Behavior: Behavior normal. Behavior is cooperative.        Thought Content: Thought content normal. Thought content is not delusional. Thought content does not include suicidal ideation.        Cognition and Memory: Cognition and memory normal. Cognition is not impaired.  She does not exhibit impaired recent memory.        Judgment: Judgment normal.     Comments: Mild mood instabilty Better mood and interests with Auvelity  wut minimal dep       Lab Review:     Component Value Date/Time   NA 139 03/27/2024 1245   K 3.9 03/27/2024 1245   CL 105 03/27/2024 1245   CO2 23 03/27/2024 1245   GLUCOSE 82 03/27/2024 1245   BUN 10 03/27/2024 1245   CREATININE 0.62 03/27/2024 1245   CREATININE 0.70 04/10/2021 1324   CALCIUM 9.3 03/27/2024 1245   PROT 6.4 (L) 03/27/2024 1245   ALBUMIN 4.2 03/27/2024 1245   AST 16 03/27/2024 1245   ALT 18 03/27/2024 1245   ALKPHOS 71 03/27/2024 1245   BILITOT 0.3 03/27/2024 1245   GFRNONAA >60 03/27/2024 1245       Component Value Date/Time   WBC 8.6 03/27/2024 1245   RBC 4.42 03/27/2024 1245   HGB 14.1 03/27/2024 1245   HCT 41.7 03/27/2024 1245   PLT 241 03/27/2024 1245   MCV 94.3 03/27/2024 1245   MCH 31.9 03/27/2024 1245   MCHC 33.8 03/27/2024 1245   RDW 12.0 03/27/2024 1245   LYMPHSABS 2,763 04/10/2021 1324   MONOABS 292 08/18/2016 0838   EOSABS 607 (H) 04/10/2021 1324   BASOSABS 74 04/10/2021 1324    No results found for: POCLITH, LITHIUM   No results found for: PHENYTOIN, PHENOBARB, VALPROATE, CBMZ   .res Assessment: Plan:    Attention deficit hyperactivity disorder (ADHD), combined type - Plan: lisdexamfetamine (VYVANSE ) 60 MG capsule  Bipolar II disorder (HCC) - Plan:  lamoTRIgine  (LAMICTAL ) 150 MG tablet  Medsensitive.  30 min session   Paisleigh has a long history of bipolar  2 disorder.  Usually manic episodes are mild.  Her mother also has bipolar disorder.  She has failed numerous mood stabilizers as noted above.     Prefers sheild-shaped lamotrigine  and oval alprazolam  vs round of each in terms of effectivenss.  Asked her to get the generic manufacturer names for these and we can request them.  Disc differences with generics.  B12 and folate levels normal 03/05/2021.  Likely some memory issues re: ADD underlying bipolar sx.  The propranolol  was very helpful for her anxiety.  It also controlled the palpitations. Try it before naps to see if it prevents NM.  We discussed the short-term risks associated with benzodiazepines including sedation and increased fall risk among others.  Discussed long-term side effect risk including dependence, potential withdrawal symptoms, and the potential eventual dose-related risk of dementia.  But recent studies from 2020 dispute this association between benzodiazepines and dementia risk. Newer studies in 2020 do not support an association with dementia.  Xanax  helped a lot with normal and social anxiety. Continue Xanax  and propranolol  prn.  Patient needs to sleep in order to control the hypomania.  Limit Xanax  DT fatigue. Only taking 0.25 mg daily.  Disc stopping it.  Supportive therapy on managing the recent stressors and disc recent LOA from work for mental health reasons.  Was helpful.  Disc possible return to in office from work at home issues. Disc getting D psych help.  Get more sleep  Auvelity  1 in the AM partially helped mood  Better duration with switch to Vyvanse  for longer duration. Discussed potential benefits, risks, and side effects of stimulants with patient to include increased heart rate, hypertension, palpitations, insomnia, increased anxiety, increased irritability, or decreased appetite.  Instructed  patient to contact office if experiencing any significant tolerability issues. Disc mania risk.  Call if occurs.    Next opiton as alternative pramipexole off label.  lamotrigine  275 mg    FU 2  mos.  Lorene Macintosh, MD, DFAPA     Please see After Visit Summary for patient specific instructions.  No future appointments.     No orders of the defined types were placed in this encounter.      -------------------------------

## 2024-06-09 ENCOUNTER — Other Ambulatory Visit: Payer: Self-pay | Admitting: Psychiatry

## 2024-06-09 DIAGNOSIS — F3181 Bipolar II disorder: Secondary | ICD-10-CM

## 2024-07-09 ENCOUNTER — Telehealth: Payer: Self-pay | Admitting: Psychiatry

## 2024-07-10 NOTE — Telephone Encounter (Signed)
Verify dose?

## 2024-07-11 NOTE — Telephone Encounter (Signed)
 Patient rtc regarding previous message states that she is prescribed two a day but she only take one a day. For financial reason she is prescribed two da day Auvelity . PH: [phone] Appt 3/2

## 2024-07-11 NOTE — Telephone Encounter (Signed)
 Rx sent for Auvelity  2/day.

## 2024-07-11 NOTE — Telephone Encounter (Signed)
 LVM per DPR. Is she taking 1 a day or 2?

## 2024-09-19 ENCOUNTER — Ambulatory Visit: Admitting: Psychiatry
